# Patient Record
Sex: Female | Born: 1960
Health system: Southern US, Community
[De-identification: ages and names within clinical notes are randomized; demographics above are authoritative.]

## PROBLEM LIST (undated history)

## (undated) DIAGNOSIS — Z9851 Tubal ligation status: Secondary | ICD-10-CM

## (undated) DIAGNOSIS — I10 Essential (primary) hypertension: Secondary | ICD-10-CM

## (undated) DIAGNOSIS — E079 Disorder of thyroid, unspecified: Secondary | ICD-10-CM

## (undated) HISTORY — PX: TUBAL LIGATION: SHX77

## (undated) HISTORY — DX: Essential (primary) hypertension: I10

## (undated) HISTORY — DX: Tubal ligation status: Z98.51

## (undated) HISTORY — DX: Disorder of thyroid, unspecified: E07.9

---

## 2011-10-13 ENCOUNTER — Encounter: Payer: Self-pay | Admitting: Family Medicine

## 2011-10-13 ENCOUNTER — Ambulatory Visit (INDEPENDENT_AMBULATORY_CARE_PROVIDER_SITE_OTHER): Payer: Self-pay | Admitting: Family Medicine

## 2011-10-13 VITALS — BP 142/100 | HR 64 | Temp 98.6°F | Ht 60.6 in | Wt 178.0 lb

## 2011-10-13 DIAGNOSIS — Z124 Encounter for screening for malignant neoplasm of cervix: Secondary | ICD-10-CM | POA: Insufficient documentation

## 2011-10-13 DIAGNOSIS — Z9851 Tubal ligation status: Secondary | ICD-10-CM

## 2011-10-13 DIAGNOSIS — Z1211 Encounter for screening for malignant neoplasm of colon: Secondary | ICD-10-CM | POA: Insufficient documentation

## 2011-10-13 DIAGNOSIS — I10 Essential (primary) hypertension: Secondary | ICD-10-CM

## 2011-10-13 HISTORY — DX: Essential (primary) hypertension: I10

## 2011-10-13 HISTORY — DX: Tubal ligation status: Z98.51

## 2011-10-13 NOTE — Progress Notes (Signed)
Interpreter Wyvonnia Dusky at [facility] for Dr Rivka Safer

## 2011-10-13 NOTE — Progress Notes (Signed)
  Subjective:    Patient ID: Caroline Oneal, female    DOB: [birth year], 51 y.o.   MRN: [id number]  HPI 1. Meet New Doctor/Need for Colonoscopy/Need for Pap Smear/ Need for mammogram Patient was interviewed by me for the first time. She was found to be HTN today. BP Readings from Last 3 Encounters:  10/13/11 142/100  Normally the patient is not HTN. She was nervous today. She is waiting for her [facility] card. She has no other past medical history. She is obese. Body mass index is 34.08 kg/(m^2). Her goals are to lose weight.   Tobacco use: Patient is a non smoker.  Review of Systems Pertinent items are noted in HPI.     Objective:   Physical Exam Filed Vitals:   10/13/11 0946  BP: 142/100  Pulse: 64  Temp: 98.6 F (37 C)  TempSrc: Oral  Height: 5' 0.6" (1.539 m)  Weight: 178 lb (80.74 kg)  Gen: Hispanic female - communicated with translator. Nad. Pleasant. Obese.  Lungs:  Normal respiratory effort, chest expands symmetrically. Lungs are clear to auscultation, no crackles or wheezes. Heart - Regular rate and rhythm.  No murmurs, gallops or rubs.    Extremities:   Non-tender, No cyanosis, edema, or deformity noted.    Assessment & Plan:

## 2011-10-13 NOTE — Assessment & Plan Note (Signed)
Patient needs PAP done. Last was 6 years ago and was normal. She will reschedule an appointment for this.

## 2011-10-13 NOTE — Assessment & Plan Note (Signed)
Patient needs a colonoscopy. She has no insurance.  I will put a referral in for a colonoscopy when she returns in two weeks. No bleeding/weight loss/stool change/constipation/fam his. Of colon ca.

## 2011-10-13 NOTE — Assessment & Plan Note (Addendum)
HTN today, normally not she says.  Will recheck in 2 weeks.

## 2011-10-13 NOTE — Patient Instructions (Signed)
It was great to see you today!  Schedule an appointment to see me in 2 weeks.   We will do a PAP next visit and check your blood pressure.

## 2011-10-27 ENCOUNTER — Ambulatory Visit (INDEPENDENT_AMBULATORY_CARE_PROVIDER_SITE_OTHER): Payer: Self-pay | Admitting: Family Medicine

## 2011-10-27 ENCOUNTER — Encounter: Payer: Self-pay | Admitting: Family Medicine

## 2011-10-27 VITALS — BP 169/118 | HR 66 | Temp 98.9°F | Wt 178.3 lb

## 2011-10-27 DIAGNOSIS — I10 Essential (primary) hypertension: Secondary | ICD-10-CM

## 2011-10-27 MED ORDER — HYDROCHLOROTHIAZIDE 25 MG PO TABS: 25.0000 mg | ORAL_TABLET | Freq: Every day | ORAL | Status: DC

## 2011-10-27 NOTE — Assessment & Plan Note (Signed)
BP Readings from Last 3 Encounters:  10/27/11 169/118  10/13/11 142/100  Still high today. Will start HCTZ 25 mg daily. F/u in two weeks.

## 2011-10-27 NOTE — Patient Instructions (Addendum)
It was great to see you today!  Schedule an appointment to see me in 2 weeks.  Schedule an appointment for your complete physical in one week.   Meds ordered this encounter  Medications  . hydrochlorothiazide (HYDRODIURIL) 25 MG tablet    Sig: Take 1 tablet (25 mg total) by mouth daily.    Dispense:  30 tablet    Refill:  6

## 2011-10-27 NOTE — Progress Notes (Signed)
  Subjective:   Patient ID: Caroline Oneal, female DOB: 01-09-1961 51 y.o. MRN: [id number] Carried out with spanish phone line HPI:  1. HTN BP Readings from Last 3 Encounters:  10/27/11 169/118  10/13/11 142/100   Onset: has been chronic  Time period of: year(s).  Course of her symptoms over time is constant. Aggravating: unknown Alleviating: none Associated sx/sn: headache and blurred vision.  Not on any meds. Has a high salt diet.   Tobacco use: Patient is a non smoker.   Review of Systems: Pertinent items are noted in HPI.  Labs Reviewed: none    Objective:   Filed Vitals:   10/27/11 1540  BP: 169/118  Pulse: 66  Temp: 98.9 F (37.2 C)  TempSrc: Oral  Weight: 178 lb 4.8 oz (80.876 kg)   Physical Exam: General: hispanic female, nad Lungs:  Normal respiratory effort, chest expands symmetrically. Lungs are clear to auscultation, no crackles or wheezes. Heart - Regular rate and rhythm.  No murmurs, gallops or rubs.    Extremities:   Non-tender, No cyanosis, edema, or deformity noted. Fundi: normal vessels  Assessment & Plan:

## 2011-11-07 ENCOUNTER — Encounter: Payer: Self-pay | Admitting: Family Medicine

## 2011-11-07 ENCOUNTER — Ambulatory Visit (INDEPENDENT_AMBULATORY_CARE_PROVIDER_SITE_OTHER): Payer: Self-pay | Admitting: Family Medicine

## 2011-11-07 VITALS — BP 138/72 | HR 74 | Temp 98.6°F | Ht 66.0 in | Wt 177.0 lb

## 2011-11-07 DIAGNOSIS — I10 Essential (primary) hypertension: Secondary | ICD-10-CM

## 2011-11-07 NOTE — Progress Notes (Signed)
  Subjective:   Patient ID: Caroline Oneal, female DOB: [birth year] 51 y.o. MRN: [id number] HPI:  1. HTN - well controlled Complaint with medications and diet. No headache, or visual changes, or chest pain/sob. BP Readings from Last 3 Encounters:  11/07/11 138/72  10/27/11 169/118  10/13/11 142/100    History  Substance Use Topics  . Smoking status: Never Smoker   . Smokeless tobacco: Not on file  . Alcohol Use: No   Review of Systems: Pertinent items are noted in HPI.  Labs Reviewed: none    Objective:   Filed Vitals:   11/07/11 1433  BP: 138/72  Pulse: 74  Temp: 98.6 F (37 C)  TempSrc: Oral  Height: 5\' 6"  (1.676 m)  Weight: 177 lb (80.287 kg)   Physical Exam: General: hispanic feamle, NAD.  With translator Lungs:  Normal respiratory effort, chest expands symmetrically. Lungs are clear to auscultation, no crackles or wheezes. Heart - Regular rate and rhythm.  No murmurs, gallops or rubs.    Abdomen: soft and non-tender without masses, organomegaly or hernias noted.  No guarding or rebound Extremities:   Non-tender, No cyanosis, edema, or deformity noted. Assessment & Plan:

## 2011-11-07 NOTE — Assessment & Plan Note (Addendum)
BP Readings from Last 3 Encounters:  11/07/11 138/72  10/27/11 169/118  10/13/11 142/100  well controlled now with HCTZ Advised to continue current meds, and low salt diet <1500 mg daily

## 2011-11-07 NOTE — Patient Instructions (Signed)
F/u for pap with female provider.  Your HTN is well controlled now.  Continue current medications.

## 2011-11-14 ENCOUNTER — Encounter: Payer: Self-pay | Admitting: Family Medicine

## 2011-12-04 ENCOUNTER — Encounter: Payer: Self-pay | Admitting: Family Medicine

## 2011-12-04 ENCOUNTER — Ambulatory Visit (INDEPENDENT_AMBULATORY_CARE_PROVIDER_SITE_OTHER): Payer: Self-pay | Admitting: Family Medicine

## 2011-12-04 VITALS — BP 132/68 | HR 84 | Temp 99.5°F | Wt 177.0 lb

## 2011-12-04 DIAGNOSIS — D509 Iron deficiency anemia, unspecified: Secondary | ICD-10-CM

## 2011-12-04 DIAGNOSIS — I10 Essential (primary) hypertension: Secondary | ICD-10-CM

## 2011-12-04 MED ORDER — HYDROCHLOROTHIAZIDE 25 MG PO TABS: 25.0000 mg | ORAL_TABLET | Freq: Every day | ORAL | Status: DC

## 2011-12-04 NOTE — Assessment & Plan Note (Signed)
BP Readings from Last 3 Encounters:  12/04/11 132/68  11/07/11 138/72  10/27/11 169/118  blood pressure well controlled now with HCTZ.

## 2011-12-04 NOTE — Patient Instructions (Signed)
It was great to see you today!  Meds ordered this encounter  Medications  . hydrochlorothiazide (HYDRODIURIL) 25 MG tablet    Sig: Take 1 tablet (25 mg total) by mouth daily.    Dispense:  30 tablet    Refill:  6

## 2011-12-04 NOTE — Progress Notes (Signed)
  Subjective:   Patient ID: Caroline Oneal, female DOB: September 05, 1960 51 y.o. MRN: [id number] HPI:  1. HTN - well controlled Complaint with medications and diet. No headache, or visual changes, or chest pain/sob. BP Readings from Last 3 Encounters:  12/04/11 132/68  11/07/11 138/72  10/27/11 169/118    History  Substance Use Topics  . Smoking status: Never Smoker   . Smokeless tobacco: Not on file  . Alcohol Use: No   Review of Systems: Pertinent items are noted in HPI.  Labs Reviewed: none    Objective:   Filed Vitals:   12/04/11 1646  BP: 132/68  Pulse: 84  Temp: 99.5 F (37.5 C)  TempSrc: Oral  Weight: 177 lb (80.287 kg)   Physical Exam: General: hispanic feamle, NAD.  With translator Lungs:  Normal respiratory effort, chest expands symmetrically. Lungs are clear to auscultation, no crackles or wheezes. Heart - Regular rate and rhythm.  No murmurs, gallops or rubs.    Abdomen: soft and non-tender without masses, organomegaly or hernias noted.  No guarding or rebound Extremities:   Non-tender, No cyanosis, edema, or deformity noted. Assessment & Plan:

## 2011-12-05 LAB — CBC
MCH: 30.5 pg (ref 26.0–34.0)
MCHC: 33.8 g/dL (ref 30.0–36.0)
MCV: 90.3 fL (ref 78.0–100.0)
Platelets: 311 10*3/uL (ref 150–400)
RDW: 14.9 % (ref 11.5–15.5)

## 2011-12-12 ENCOUNTER — Ambulatory Visit (INDEPENDENT_AMBULATORY_CARE_PROVIDER_SITE_OTHER): Payer: Self-pay | Admitting: Family Medicine

## 2011-12-12 ENCOUNTER — Other Ambulatory Visit (HOSPITAL_COMMUNITY)
Admission: RE | Admit: 2011-12-12 | Discharge: 2011-12-12 | Disposition: A | Discharge status: Home / Self Care | Payer: Self-pay | Source: Ambulatory Visit | Attending: Family Medicine | Admitting: Family Medicine

## 2011-12-12 ENCOUNTER — Encounter: Payer: Self-pay | Admitting: Family Medicine

## 2011-12-12 VITALS — BP 131/91 | HR 83 | Temp 98.0°F | Ht 66.0 in | Wt 171.7 lb

## 2011-12-12 DIAGNOSIS — Z01419 Encounter for gynecological examination (general) (routine) without abnormal findings: Secondary | ICD-10-CM

## 2011-12-12 DIAGNOSIS — I10 Essential (primary) hypertension: Secondary | ICD-10-CM

## 2011-12-12 DIAGNOSIS — K5900 Constipation, unspecified: Secondary | ICD-10-CM | POA: Insufficient documentation

## 2011-12-12 DIAGNOSIS — Z23 Encounter for immunization: Secondary | ICD-10-CM

## 2011-12-12 DIAGNOSIS — Z124 Encounter for screening for malignant neoplasm of cervix: Secondary | ICD-10-CM

## 2011-12-12 DIAGNOSIS — Z1239 Encounter for other screening for malignant neoplasm of breast: Secondary | ICD-10-CM

## 2011-12-12 MED ORDER — DOCUSATE SODIUM 100 MG PO CAPS: 100.0000 mg | ORAL_CAPSULE | Freq: Every day | ORAL | Status: AC

## 2011-12-12 NOTE — Progress Notes (Signed)
Interpreter Wyvonnia Dusky for Female physical

## 2011-12-12 NOTE — Patient Instructions (Addendum)
Colace every day as needed for constipation  Will schedule mammogram  Eat high fiber diet - whole wheat, vegetables, can use supplement like metamucil  Return for fasting bloodwork (only water for 8 hours)- make appointment

## 2011-12-12 NOTE — Progress Notes (Signed)
  Subjective:    Patient ID: Caroline Oneal, female    DOB: [birth year], 51 y.o.   MRN: [id number]  HPI  Annual Gynecological Exam  G4L4 Wt Readings from Last 3 Encounters:  12/12/11 171 lb 11.2 oz (77.883 kg)  12/04/11 177 lb (80.287 kg)  11/07/11 177 lb (80.287 kg)   Last period:  10 years ago Regular periods: yes Heavy bleeding: no  Sexually active: yes Birth control or hormonal therapy: Hx of STD: Patient desires STD screening Dyspareunia: No Hot flashes: No Vaginal discharge: no Dysuria:No  Last mammogram: has never had one Breast mass or concerns: No Last Pap: 15 years ago, was normal History of abnormal pap: No   FH of breast, uterine, ovarian, colon cancer: No  Constipation:  Noted a painless bump near rectum.  Was found to be small external hemorrhoid.  Patient reports hard stools  overweight:  Walks an hour every day.  At a fast pace.  Has been feeling well.    Review of Systemsaee HPI     Objective:   Physical Exam GEN: Alert & Oriented, No acute distress Neck: supple, no thyromegaly. CV:  Regular Rate & Rhythm, no murmur Respiratory:  Normal work of breathing, CTAB Abd:  + BS, soft, no tenderness to palpation Ext: no pre-tibial edema .Pelvic Exam:        External: normal female genitalia without lesions or masses        Vagina: normal without lesions or masses        Cervix: normal without lesions or masses        Adnexa: normal bimanual exam without masses or fullness        Uterus: normal by palpation        Pap smear: performed Rectum:  small non acute thrombosed external hemorrhoid       Assessment & Plan:  Will order fasting labwork given fh of DM, overweight, hypertension

## 2011-12-12 NOTE — Assessment & Plan Note (Addendum)
Colace, discussed high fiber diet and fiber supplements.  Will check TSH.  PCP is in process of referring for colorectal screening.

## 2011-12-13 NOTE — Addendum Note (Signed)
Addended by: Edd Arbour on: 12/13/2011 08:44 AM   Modules accepted: Orders

## 2011-12-14 ENCOUNTER — Encounter: Payer: Self-pay | Admitting: Family Medicine

## 2012-01-08 ENCOUNTER — Telehealth: Payer: Self-pay | Admitting: *Deleted

## 2012-01-08 NOTE — Telephone Encounter (Signed)
Patient's daughter called due to patient's "teeth hurting."  Wants referral to dentist.  Informed will need office visit here to be evaluated for dental referral.  Appt scheduled for tomorrow at 4:15pm with Dr. Cristal Ford.  Gaylene Brooks, RN

## 2012-01-09 ENCOUNTER — Ambulatory Visit (INDEPENDENT_AMBULATORY_CARE_PROVIDER_SITE_OTHER): Payer: Self-pay | Admitting: Family Medicine

## 2012-01-09 ENCOUNTER — Encounter: Payer: Self-pay | Admitting: Family Medicine

## 2012-01-09 VITALS — BP 149/86 | HR 63 | Temp 99.0°F | Ht 60.75 in | Wt 177.4 lb

## 2012-01-09 DIAGNOSIS — K0530 Chronic periodontitis, unspecified: Secondary | ICD-10-CM | POA: Insufficient documentation

## 2012-01-09 MED ORDER — PENICILLIN V POTASSIUM 250 MG PO TABS: 250.0000 mg | ORAL_TABLET | Freq: Three times a day (TID) | ORAL | Status: AC

## 2012-01-09 MED ORDER — HYDROCODONE-ACETAMINOPHEN 5-500 MG PO TABS: 1.0000 | ORAL_TABLET | Freq: Three times a day (TID) | ORAL | Status: AC | PRN

## 2012-01-09 NOTE — Progress Notes (Signed)
  Subjective:    Patient ID: Caroline Oneal, female    DOB: [birth year], 51 y.o.   MRN: [id number]  HPI  1. Dental pain. Pain in left maxillary area, upper molar and the right lower molar for the past few days. Soreness in gums. Radiates up to eye region. Mild swelling in sinus. Had two wisdom teeth extracted from upper teeth about 4 months ago. Denies fever, pus, redness, syncope, nausea, diffculty breathing or swallowing.  Review of Systems See HPI otherwise negative.  reports that she has never smoked. She does not have any smokeless tobacco history on file.     Objective:   Physical Exam  Vitals reviewed. Constitutional: She is oriented to person, place, and time. She appears well-developed and well-nourished. No distress.  HENT:  Head: Normocephalic and atraumatic.  Right Ear: External ear normal.  Left Ear: External ear normal.  Nose: Nose normal.  Mouth/Throat: Oropharynx is clear and moist. No oropharyngeal exudate.       Left maxillary TTP. Left upper maxillary molar seems loose, very faint erythema surrounding gingiva. No abscesses seen.  Left cervical LAD mild. Neck supple.  Eyes: EOM are normal. Pupils are equal, round, and reactive to light.  Neck: Normal range of motion. Neck supple.  Cardiovascular: Normal rate, regular rhythm and normal heart sounds.   No murmur heard. Pulmonary/Chest: Effort normal.  Musculoskeletal: She exhibits no edema and no tenderness.  Neurological: She is alert and oriented to person, place, and time. No cranial nerve deficit.       Speech normal. No CN deficits.  Skin: No rash noted.  Psychiatric: She has a normal mood and affect.          Assessment & Plan:

## 2012-01-09 NOTE — Patient Instructions (Addendum)
You may have infection in your teeth. Start taking penicillin three times daily. You may use pain medication if necessary. Will make new dental referral. If you have fevers, more swelling, trouble swallowing go to the emergency room.  Abceso Dental (Dental Abscess) Usted tiene un absceso en un diente. Este problema aparece cuando un diente se infecta. Los antibiticos y los analgsicos podrn Automotive engineer, Biomedical engineer las infecciones dentales requieren la atencin de un dentista. Enjuguese la zona infectada con agua con sal (una pizca en 250 ml [8 oz] de agua tibia). No aplique calor en la zona externa del rostro Consulte a su dentista o al cirujano lo antes posible. SOLICITE ATENCIN MDICA DE INMEDIATO SI PRESENTA:  Dolor intenso que [place] y no [place] con medicamentos   Usted o su nio tienen una temperatura oral de ms de 102 F (38.9 C) y no puede controlarla con medicamentos.   Su beb tiene ms de 3 meses y su temperatura rectal es de 102 F (38.9 C) o ms.   Su beb tiene 3 meses o menos y su temperatura rectal es de 100.4 F (38 C) o ms.   Le siente escalofros, dolor de cabeza intenso, dificultad para respirar o tragar.   Observa hinchazn en el cuello o alrededor del ojo.  Document Released: 06/12/2005 Document Revised: 06/01/2011 [facility] Patient Information 2012 [place], [place].

## 2012-01-09 NOTE — Assessment & Plan Note (Signed)
Worsening pain in molar. Had adjacent extraction 4 months ago. No fever or systemic signs. Start penicillin to prevent spread of infection, vicodin and motrin prn for pain. Dental referral-advised by Rudell Cobb that new referral is necessary for evaluation. Advised to f/u in ED for worsening symptoms, fever, emesis, difficulty swallowing.

## 2012-10-04 ENCOUNTER — Ambulatory Visit: Payer: Self-pay | Admitting: Family Medicine

## 2012-10-10 ENCOUNTER — Encounter: Payer: Self-pay | Admitting: Family Medicine

## 2012-10-10 ENCOUNTER — Ambulatory Visit (INDEPENDENT_AMBULATORY_CARE_PROVIDER_SITE_OTHER): Payer: No Typology Code available for payment source | Admitting: Family Medicine

## 2012-10-10 ENCOUNTER — Ambulatory Visit (HOSPITAL_COMMUNITY)
Admission: RE | Admit: 2012-10-10 | Discharge: 2012-10-10 | Disposition: A | Discharge status: Home / Self Care | Payer: No Typology Code available for payment source | Source: Ambulatory Visit | Attending: Family Medicine | Admitting: Family Medicine

## 2012-10-10 VITALS — BP 149/81 | HR 72 | Ht 60.5 in | Wt 160.0 lb

## 2012-10-10 DIAGNOSIS — M25561 Pain in right knee: Secondary | ICD-10-CM

## 2012-10-10 DIAGNOSIS — I10 Essential (primary) hypertension: Secondary | ICD-10-CM

## 2012-10-10 DIAGNOSIS — K5900 Constipation, unspecified: Secondary | ICD-10-CM

## 2012-10-10 DIAGNOSIS — M25569 Pain in unspecified knee: Secondary | ICD-10-CM | POA: Insufficient documentation

## 2012-10-10 LAB — CBC
HCT: 40.7 % (ref 36.0–46.0)
MCHC: 33.9 g/dL (ref 30.0–36.0)
Platelets: 365 10*3/uL (ref 150–400)
RDW: 14.3 % (ref 11.5–15.5)
WBC: 8.3 10*3/uL (ref 4.0–10.5)

## 2012-10-10 MED ORDER — HYDROCHLOROTHIAZIDE 25 MG PO TABS: 25.0000 mg | ORAL_TABLET | Freq: Every day | ORAL | Status: DC

## 2012-10-10 NOTE — Assessment & Plan Note (Signed)
Began after a fall 3 months ago. Seems likely for internal derangement possibly meniscal injury. Check plain film to rule out bone fragment or pre-existing osteoarthritis. Recommend OTC analgesics, rest, ice. F/u in 3-4 weeks for further assessment.

## 2012-10-10 NOTE — Patient Instructions (Addendum)
Get your xray done of knee.  Get done at [facility] radiology on first floor.  You can try aleve, ibuprofen, or tylenol (acetaminophen).  Make an appointment in 3-4 weeks for check up. We can talk about xray and blood work then.

## 2012-10-10 NOTE — Assessment & Plan Note (Signed)
Above goal due to noncompliance. med refilled. Will get fasting labs today since she never returned previously.

## 2012-10-10 NOTE — Progress Notes (Signed)
  Subjective:    Patient ID: Caroline Oneal, female    DOB: 05/18/1961, 52 y.o.   MRN: [id number]  Hypertension   Presents with daughter, refuses formal or phone interpretor.   1. Right knee pain. Started 3 months ago after she fell while running down some stairs. Landed directly onto right knee. Some pain initially, but was able to walk. Experiences now some intermittent pain in medial and posterior aspect, mild intermittent swelling also. Feels some popping and unstable at times. Worse with climbing stairs and at night time. Motrin helps some.  Denies fever, chills, recent falls, calf pain, redness, previous hx of knee problems.   2. HTN. Ran out of med 6 months ago. Never returned for fasting labs ordered last visit. Requests refills. No chest pain, edema, rash, nausea, emesis.  Review of Systems See HPI otherwise negative.  reports that she has never smoked. She does not have any smokeless tobacco history on file.     Objective:   Physical Exam  Vitals reviewed. Constitutional: She is oriented to person, place, and time. She appears well-developed and well-nourished. No distress.  HENT:  Head: Normocephalic and atraumatic.  Cardiovascular: Normal rate, regular rhythm and normal heart sounds.   Musculoskeletal: She exhibits tenderness.  Right knee lacks 5-10 degree flexion. Extension is intact bilaterally.  Right knee: Mild-moderate TTP at medial joint line. Trace suprapatellar effusion likely on right. Positive mcmurrays test-pain elicited. Patella has some decreased ROM, grinding is present.   No calf erythema, edema or rash.  2+ DP pulses.   Neurological: She is alert and oriented to person, place, and time.  Skin: She is not diaphoretic.       Assessment & Plan:

## 2012-10-11 LAB — COMPREHENSIVE METABOLIC PANEL
ALT: 13 U/L (ref 0–35)
AST: 15 U/L (ref 0–37)
Alkaline Phosphatase: 91 U/L (ref 39–117)
Calcium: 9.7 mg/dL (ref 8.4–10.5)
Chloride: 107 mEq/L (ref 96–112)
Creat: 0.61 mg/dL (ref 0.50–1.10)

## 2012-10-11 LAB — LIPID PANEL
HDL: 42 mg/dL (ref >39)
LDL Cholesterol: 107 mg/dL — ABNORMAL HIGH (ref 0–99)
Total CHOL/HDL Ratio: 4.4 Ratio
VLDL: 37 mg/dL (ref 0–40)

## 2012-10-21 ENCOUNTER — Telehealth: Payer: Self-pay | Admitting: Family Medicine

## 2012-10-21 NOTE — Telephone Encounter (Signed)
Please inform patient that her labs showed some abnormality in the thyroid, and we should do another lab test to investigate further. She needs a follow up visit with me in next 2-3 weeks.

## 2012-10-22 NOTE — Telephone Encounter (Signed)
I called pt and lvm to call us back,  Pt has a future appt on 05/14@3 :15pm. I will try to call later.  Marines

## 2012-11-06 ENCOUNTER — Ambulatory Visit: Payer: Self-pay | Admitting: Family Medicine

## 2014-03-31 ENCOUNTER — Ambulatory Visit: Payer: Self-pay | Attending: Internal Medicine

## 2014-06-03 ENCOUNTER — Ambulatory Visit: Payer: Self-pay | Attending: Family Medicine | Admitting: Family Medicine

## 2014-06-03 ENCOUNTER — Ambulatory Visit (HOSPITAL_BASED_OUTPATIENT_CLINIC_OR_DEPARTMENT_OTHER): Payer: Self-pay | Admitting: *Deleted

## 2014-06-03 ENCOUNTER — Encounter: Payer: Self-pay | Admitting: Family Medicine

## 2014-06-03 VITALS — BP 180/84 | HR 60 | Temp 98.4°F | Resp 18 | Ht 62.0 in | Wt 165.0 lb

## 2014-06-03 DIAGNOSIS — R7303 Prediabetes: Secondary | ICD-10-CM | POA: Insufficient documentation

## 2014-06-03 DIAGNOSIS — I10 Essential (primary) hypertension: Secondary | ICD-10-CM | POA: Insufficient documentation

## 2014-06-03 DIAGNOSIS — Z23 Encounter for immunization: Secondary | ICD-10-CM

## 2014-06-03 DIAGNOSIS — E669 Obesity, unspecified: Secondary | ICD-10-CM

## 2014-06-03 DIAGNOSIS — R7309 Other abnormal glucose: Secondary | ICD-10-CM

## 2014-06-03 DIAGNOSIS — R7989 Other specified abnormal findings of blood chemistry: Secondary | ICD-10-CM

## 2014-06-03 DIAGNOSIS — R946 Abnormal results of thyroid function studies: Secondary | ICD-10-CM

## 2014-06-03 DIAGNOSIS — E039 Hypothyroidism, unspecified: Secondary | ICD-10-CM

## 2014-06-03 LAB — CBC WITH DIFFERENTIAL/PLATELET
BASOS ABS: 0.1 10*3/uL (ref 0.0–0.1)
BASOS PCT: 1 % (ref 0–1)
Eosinophils Absolute: 0.3 10*3/uL (ref 0.0–0.7)
Eosinophils Relative: 5 % (ref 0–5)
HCT: 39.8 % (ref 36.0–46.0)
Hemoglobin: 13.5 g/dL (ref 12.0–15.0)
LYMPHS PCT: 35 % (ref 12–46)
Lymphs Abs: 2.3 10*3/uL (ref 0.7–4.0)
MCH: 30.1 pg (ref 26.0–34.0)
MCHC: 33.9 g/dL (ref 30.0–36.0)
MCV: 88.8 fL (ref 78.0–100.0)
MONO ABS: 0.5 10*3/uL (ref 0.1–1.0)
MONOS PCT: 7 % (ref 3–12)
MPV: 9.5 fL (ref 9.4–12.4)
NEUTROS ABS: 3.4 10*3/uL (ref 1.7–7.7)
Neutrophils Relative %: 52 % (ref 43–77)
PLATELETS: 325 10*3/uL (ref 150–400)
RBC: 4.48 MIL/uL (ref 3.87–5.11)
RDW: 14.1 % (ref 11.5–15.5)
WBC: 6.6 10*3/uL (ref 4.0–10.5)

## 2014-06-03 LAB — POCT GLYCOSYLATED HEMOGLOBIN (HGB A1C): Hemoglobin A1C: 6.1

## 2014-06-03 MED ORDER — HYDROCHLOROTHIAZIDE 25 MG PO TABS: 25.0000 mg | ORAL_TABLET | Freq: Every day | ORAL | Status: DC

## 2014-06-03 NOTE — Assessment & Plan Note (Signed)
A: prediabetes P: Weight loss Low carb diet Evaluate and treat hypothyroidism

## 2014-06-03 NOTE — Assessment & Plan Note (Addendum)
A: BP elevated. P: Thiazide diuretic at max dose Close f/u in 2 weeks If BP still above goal at f/u add ACE/ARB

## 2014-06-03 NOTE — Progress Notes (Signed)
   Subjective:    Patient ID: Derenda FennelMaria Renteria, female    DOB: [birth year], 53 y.o.   MRN: [id number] CC; chronic HTN, untreated  HPI 1. CHRONIC HYPERTENSION  Disease Monitoring  Blood pressure range: does not check   Chest pain: no   Dyspnea: no   Claudication: no   Medication compliance: no  Medication Side Effects  Lightheadedness: yes, sometimes    Urinary frequency: no   Edema: no   Impotence: no   Preventitive Healthcare:  Exercise: no   Diet Pattern: low carb,   Salt Restriction: yes   2. Elevated TSH: [facility]. Patient did not get message. F/u labs not done. Admits to tingling in hands and weight gain. Denies edema, chest pain, SOB, presyncope/syncope.   Soc Hx: non smoker Med Hx: HTN since 09/2011  Fam Hx: negative for HTN  Review of Systems As per HPI     Objective:   Physical Exam BP 180/84 mmHg  Pulse 60  Temp(Src) 98.4 F (36.9 C) (Oral)  Resp 18  Ht 5\' 2"  (1.575 m)  Wt 165 lb (74.844 kg)  BMI 30.17 kg/m2  SpO2 99%  Wt Readings from Last 3 Encounters:  06/03/14 165 lb (74.844 kg)  10/10/12 160 lb (72.576 kg)  01/09/12 177 lb 6.4 oz (80.468 kg)    BP Readings from Last 3 Encounters:  06/03/14 180/84  10/10/12 149/81  01/09/12 149/86  General appearance: alert, cooperative and no distress, puffiness around eyes and cheeks.  Head: Normocephalic, without obvious abnormality, atraumatic Eyes: conjunctivae/corneas clear. PERRL, EOM's intact. Fundi benign. Neck: no adenopathy, no carotid bruit, no JVD, supple, symmetrical, trachea midline and thyroid not enlarged, symmetric, no tenderness/mass/nodules Lungs: clear to auscultation bilaterally Heart: regular rate and rhythm, S1, S2 normal, no murmur, click, rub or gallop Extremities: extremities normal, atraumatic, no cyanosis or edema       Assessment & Plan:

## 2014-06-03 NOTE — Patient Instructions (Addendum)
Caroline Oneal,  Thank you for coming in today. It was a pleasure meeting you. I look forward to being your primary doctor.  1. High blood start HCTZ 25 mg daily.  Exercise Low salt, low carb, low fat diet. Plenty of vegetables in diet.   2. High TSH concerning for hypothyroidism.  Getting labs. If TSH still high and T3/T4 low will start thyroid replacement hormone.   Caroline HanlyStella will call you with lab results.   F/u in 3 weeks for BP check with nurse F/u in 6 weeks with me.   Dr. Armen Oneal    Hipotiroidismo (Hypothyroidism) La tiroides es una glndula grande ubicada en la parte anterior e inferior del cuello. La glndula tiroides interviene en el control del metabolismo. El metabolismo es el modo en que el organismo utiliza los alimentos. El control del metabolismo se realiza a travs de una hormona denominada tiroxina. Cuando la actividad de esta glndula est por debajo de lo normal (hipotiroidismo) produce muy poca cantidad de hormona. CAUSAS Aqu se incluyen:   Ausencia de tejido tiroideo.  Bocio por dficit de yodo.  Bocio por medicamentos.  Defectos congnitos (desde el nacimiento).  Trastornos de la glndula pituitaria Esto ocasiona la falta de TSH (sigla que significa hormona estimulante de la tiroides) Esta hormona le informa a la tiroides que debe producir ms hormona. SNTOMAS  Letargia (sentir que no se tiene Engineer, drillingenerga)  Intolerancia al fro  Smith Internationalumento de peso (a pesar de una ingesta normal de alimentos)  Piel seca  Cabello seco  Irregularidades menstruales  Enlentecimiento de los procesos de pensamiento La insuficiente cantidad de hormona tiroidea tambin puede ocasionar problemas cardacos. El hipotiroidismo en el recin nacido es el cretinismo en su forma extrema. Es importante que esta forma se trate de modo adecuado e inmediato, ya que puede conducir rpidamente al retardo del desarrollo fsico y mental. DIAGNSTICO Para comprobar la existencia de  hipotiroidismo, Hydrographic surveyorel profesional le solicitar anlisis de sangre y radiografas y estudios con Gravityultrasonido. Muchas veces los signos estn ocultos. Es necesario que el profesional vigile la enfermedad con anlisis de Arcolasangre. Esto se realiza luego de Actorestablecer un diagnstico (determinar cul es el problema). Puede ser necesario que el profesional que lo asiste controle esta enfermedad con anlisis de sangre ya sea antes o despus del diagnstico y Scalp Levelel tratamiento. TRATAMIENTO Los niveles bajos de hormona tiroidea se incrementan con el uso de hormona tiroidea sinttica. Este es un tratamiento seguro y Walnutefectivo. Se dispone de hormona tiroidea sinttica para el tratamiento de este trastorno. Generalmente lleva algunas semanas obtener el efecto total de los medicamentos. Luego de obtener el efecto completo del Fancy Gapmedicamento, habitualmente pasan otras cuatro semanas para que los sntomas empiezan a Geneticist, moleculardesaparecer. El profesional podr comenzar indicndole dosis bajas. Si usted tuvo problemas cardacos, la dosis se aumentar de manera gradual. Podr volver a lo normal sin Marketing executiveentrar en una situacin de emergencia. INSTRUCCIONES PARA EL CUIDADO DOMICILIARIO  Tome los Estée Laudermedicamentos como le ha indicado el profesional que lo asiste. Infrmele al profesional todos los medicamentos que toma o que ha comenzado a Careers advisertomar. El profesional que lo asiste lo ayudar con los esquemas de las dosis.  A medida que obtiene mejora, es necesario aumentar la dosis. Ser necesario Education officer, environmentalrealizar continuos anlisis de Brinkleysangre, segn lo indique el profesional.  Informe acerca de todos los efectos secundarios que sospeche que podran deberse a los medicamentos. SOLICITE ATENCIN MDICA SI: Solicite atencin mdica si observa:  Sudoracin.  Temblores.  Ansiedad.  Rpida prdida de peso.  Intolerancia  al calor.  Cambios emocionales.  Diarrea.  Debilidad. SOLICITE ATENCIN MDICA DE INMEDIATO SI: Presenta dolor en el pecho, una frecuencia  cardaca irregular (palpitaciones) o latidos cardacos rpidos. EST SEGURO QUE:   Comprende las instrucciones para el alta mdica.  Controlar su enfermedad.  Solicitar atencin mdica de inmediato segn las indicaciones. Document Released: 06/12/2005 Document Revised: 09/04/2011 [facility] Patient Information 2015 [place], [place]. This information is not intended to replace advice given to you by your health care provider. Make sure you discuss any questions you have with your health care provider.

## 2014-06-03 NOTE — Assessment & Plan Note (Addendum)
A: suspect hypothyroidism P: TSH Free T3 and T4 Thyroid replacement on monitor response to treatment Lipids   Lab Results  Component Value Date   TSH 14.523* 06/03/2014  low T4 Plan  Weight base synthroid replacement (1/7 mcg/kg/day=127 mcg) = 125 mcg, order sent to pharmacy.

## 2014-06-03 NOTE — Progress Notes (Signed)
Establish Care Hx HTN, not taking medication for more then one year Complaining of dizziness and eye pressure on and off Numbness and pain  on Rt hand  No tobacco  use no ETOH use

## 2014-06-04 LAB — COMPLETE METABOLIC PANEL WITH GFR
ALBUMIN: 4.2 g/dL (ref 3.5–5.2)
ALK PHOS: 79 U/L (ref 39–117)
ALT: 14 U/L (ref 0–35)
AST: 16 U/L (ref 0–37)
BILIRUBIN TOTAL: 0.4 mg/dL (ref 0.2–1.2)
BUN: 16 mg/dL (ref 6–23)
CALCIUM: 9.6 mg/dL (ref 8.4–10.5)
CO2: 27 meq/L (ref 19–32)
Chloride: 109 mEq/L (ref 96–112)
Creat: 0.6 mg/dL (ref 0.50–1.10)
GFR, Est Non African American: >89 mL/min
Glucose, Bld: 99 mg/dL (ref 70–99)
Potassium: 4.5 mEq/L (ref 3.5–5.3)
SODIUM: 141 meq/L (ref 135–145)
TOTAL PROTEIN: 7.4 g/dL (ref 6.0–8.3)

## 2014-06-04 LAB — LIPID PANEL
Cholesterol: 178 mg/dL (ref 0–200)
HDL: 45 mg/dL (ref >39)
LDL CALC: 110 mg/dL — AB (ref 0–99)
TRIGLYCERIDES: 115 mg/dL (ref <150)
Total CHOL/HDL Ratio: 4 Ratio
VLDL: 23 mg/dL (ref 0–40)

## 2014-06-04 LAB — T4, FREE: FREE T4: 0.67 ng/dL — AB (ref 0.80–1.80)

## 2014-06-04 LAB — T3: T3 TOTAL: 104.5 ng/dL (ref 80.0–204.0)

## 2014-06-04 LAB — TSH: TSH: 14.523 u[IU]/mL — AB (ref 0.350–4.500)

## 2014-06-04 MED ORDER — LEVOTHYROXINE SODIUM 125 MCG PO TABS: 125.0000 ug | ORAL_TABLET | Freq: Every day | ORAL | Status: DC

## 2014-06-04 NOTE — Addendum Note (Signed)
Addended by: Dessa PhiFUNCHES, Zeyad Delaguila on: 06/04/2014 02:16 PM   Modules accepted: Orders

## 2014-06-16 ENCOUNTER — Telehealth: Payer: Self-pay | Admitting: *Deleted

## 2014-06-16 NOTE — Telephone Encounter (Signed)
-----   Message from Lora PaulaJosalyn C Funches, MD sent at 06/04/2014  2:12 PM EST ----- Normal CMP Normal CBC with diff Just slight elevated LDL, bad cholesterol, increase vegetables and increase exercise.  TSH elevated with low free T4. Thyroid replacement ordered 125 mcg daily in AM on empty stomach.

## 2014-06-16 NOTE — Telephone Encounter (Signed)
Left voice message to return call 

## 2014-06-24 ENCOUNTER — Ambulatory Visit: Payer: Self-pay | Attending: Family Medicine

## 2014-06-24 NOTE — Progress Notes (Unsigned)
Patient ID: Caroline FennelMaria Oneal, female   DOB: February 26, 1961, 53 y.o.   MRN: [id number] Pt comes in for blood pressure recheck after started on Hydrochlorothiazide  25 mg tab per last visit 06/03/14 Pt states she is compliant with taking medication daily  Denies headache,dizziness or numb/tingling sensation per spanish interpretor BP- 122/77 54 Pt given instructions to continue taking medication and return at next appointment DASH/Hypertension literature given per AVS-Shanaya Schneck,RN

## 2014-06-24 NOTE — Patient Instructions (Signed)
Hipertensin (Hypertension) La hipertensin, conocida comnmente como presin arterial alta, se produce cuando la sangre bombea en las arterias con mucha fuerza. Las arterias son los vasos sanguneos que transportan la sangre desde el corazn hacia todas las partes del cuerpo. Una lectura de la presin arterial consiste en un nmero ms alto sobre un nmero ms bajo, por ejemplo, 110/72. El nmero ms alto (presin sistlica) corresponde a la presin interna de las arterias cuando el corazn [place]. El nmero ms bajo (presin diastlica) corresponde a la presin interna de las arterias cuando el corazn se relaja. En condiciones ideales, la presin arterial debe ser inferior a 120/80. La hipertensin fuerza al corazn a trabajar ms para Herbalist. Las arterias pueden estrecharse o ponerse rgidas. La hipertensin conlleva el riesgo de enfermedad cardaca, ictus y otros problemas.  [place] de riesgo de hipertensin son controlables, pero otros no lo son.  [employer] factores de riesgo que usted no puede Chief Technology Officer, se incluyen:   Manufacturing systems engineer. El riesgo es mayor para las Retail banker.  La edad. Los riesgos aumentan con la edad.  El sexo. Antes de los 45aos, los hombres corren ms [employer]. Despus de los 65aos, las mujeres corren ms [employer]. Entre los factores de riesgo que usted puede Chief Technology Officer, se incluyen:  No hacer la cantidad suficiente de actividad fsica o ejercicio.  Tener sobrepeso.  Consumir mucha grasa, azcar, caloras o sal en la dieta.  Beber alcohol en exceso. SIGNOS Y SNTOMAS Por lo general, la hipertensin no causa signos o sntomas. La hipertensin demasiado alta (crisis hipertensiva) puede causar dolor de cabeza, ansiedad, falta de aire y hemorragia nasal. DIAGNSTICO  Para detectar si usted tiene hipertensin, el mdico le medir la presin arterial mientras est sentado, con el brazo  levantado a la altura del corazn. Debe medirla al [facility] veces en el mismo brazo. Determinadas condiciones pueden causar una diferencia de presin arterial entre el brazo izquierdo y Insurance underwriter. El hecho de tener una sola lectura de la presin arterial ms alta que lo normal no significa que Stage manager. En el caso de tener una lectura de la presin arterial con un valor alto, pdale al mdico que la verifique nuevamente. [place] hipertensin arterial incluye hacer cambios en el estilo de vida y, posiblemente, tomar medicamentos. Un estilo de vida saludable puede ayudar a bajar la presin arterial alta. Quiz deba cambiar algunos hbitos. Los [employer] en el estilo de vida pueden incluir:  Seguir la dieta DASH. Esta dieta tiene un alto contenido de frutas, verduras y Psychologist, prison and probation services. Incluye poca cantidad de sal, carnes rojas y azcares agregados.  Hacer al menos 2horas de actividad fsica enrgica todas las semanas.  Perder peso, si es necesario.  No fumar.  Limitar el consumo de bebidas alcohlicas.  Aprender formas de reducir el estrs. Si los cambios en el estilo de vida no son suficientes para Child psychotherapist la presin arterial, el mdico puede recetarle medicamentos. Quiz necesite tomar ms de uno. Trabaje en conjunto con su mdico para comprender los riesgos y los beneficios. INSTRUCCIONES PARA EL CUIDADO EN EL HOGAR  Haga que le midan de nuevo la presin arterial segn las indicaciones del [place] los medicamentos solamente como se lo haya indicado el mdico. Siga cuidadosamente las indicaciones. Los medicamentos para la presin arterial deben tomarse segn las indicaciones. Los medicamentos pierden eficacia al omitir las dosis. El hecho de omitir  las dosis tambin aumenta el riesgo de otros problemas.  No fume.  Contrlese la presin arterial en su casa segn las indicaciones del mdico. SOLICITE ATENCIN MDICA SI:   Piensa  que tiene una reaccin alrgica a los medicamentos.  Tiene mareos o dolores de cabeza con recurrencia.  Tiene hinchazn en los tobillos.  Tiene problemas de visin. SOLICITE ATENCIN MDICA DE INMEDIATO SI:  Siente un dolor de cabeza intenso o confusin.  Siente debilidad inusual, adormecimiento o que se desmayar.  Siente dolor intenso en el pecho o en el abdomen.  Vomita repetidas veces.  Tiene dificultad para respirar. ASEGRESE DE QUE:   Comprende estas instrucciones.  Controlar su afeccin.  Recibir ayuda de inmediato si no mejora o si empeora. Document Released: 06/12/2005 Document Revised: 10/27/2013 ExitCare Patient Information 2015 ExitCare, LLC. This information is not intended to replace advice given to you by your health care provider. Make sure you discuss any questions you have with your health care provider. Plan de alimentacin DASH (DASH Eating Plan) DASH es la sigla en ingls de "Enfoques Alimentarios para Detener la Hipertensin". El plan de alimentacin DASH ha demostrado bajar la presin arterial elevada (hipertensin). Los beneficios adicionales para la salud pueden incluir la disminucin del riesgo de diabetes mellitus tipo2, enfermedades cardacas e ictus. Este plan tambin puede ayudar a adelgazar. QU DEBO SABER ACERCA DEL PLAN DE ALIMENTACIN DASH? Para el plan de alimentacin DASH, seguir las siguientes pautas generales:  Elija los alimentos con un valor porcentual diario de sodio de menos del 5% (segn figura en la etiqueta del alimento).  Use hierbas o aderezos sin sal, en lugar de sal de mesa o sal marina.  Consulte al mdico o farmacutico antes de usar sustitutos de la sal.  Coma productos con bajo contenido de sodio, cuya etiqueta suele decir "bajo contenido de sodio" o "sin agregado de sal".  Coma alimentos frescos.  Coma ms verduras, frutas y productos lcteos con bajo contenido de grasas.  Elija los cereales integrales. Busque  la palabra "integral" en el primer lugar de la lista de ingredientes.  Elija el pescado y el pollo o el pavo sin piel ms a menudo que las carnes rojas. Limite el consumo de pescado, carne de ave y carne a 6onzas (170g) por da.  Limite el consumo de dulces, postres, azcares y bebidas azucaradas.  Elija las grasas saludables para el corazn.  Limite el consumo de queso a 1onza (28g) por da.  Consuma ms comida casera y menos de restaurante, de buf y comida rpida.  Limite el consumo de alimentos fritos.  Cocine los alimentos utilizando mtodos que no sean la fritura.  Limite las verduras enlatadas. Si las consume, enjuguelas bien para disminuir el sodio.  Cuando coma en un restaurante, pida que preparen su comida con menos sal o, en lo posible, sin nada de sal. QU ALIMENTOS PUEDO COMER? Pida ayuda a un nutricionista para conocer las necesidades calricas individuales. Cereales Pan de salvado o integral. Arroz integral. Pastas de salvado o integrales. Quinua, trigo burgol y cereales integrales. Cereales con bajo contenido de sodio. Tortillas de harina de maz o de salvado. Pan de maz integral. Galletas saladas integrales. Galletas con bajo contenido de sodio. Vegetales Verduras frescas o congeladas (crudas, al vapor, asadas o grilladas). Jugos de tomate y verduras con contenido bajo o reducido de sodio. Pasta y salsa de tomate con contenido bajo o reducido de sodio. Verduras enlatadas con bajo contenido de sodio o reducido de sodio.  Frutas Frutas frescas,   en conserva (en su jugo natural) o frutas congeladas. Carnes y otros productos con protenas Carne de res molida (al 85% o ms [place]omagra), carne de res de animales alimentados con pastos o carne de res sin la grasa. Pollo o pavo sin piel. Carne de pollo o de [place] Forestpavo molida. Cerdo sin la grasa. Todos los pescados y frutos de mar. Huevos. Porotos, guisantes o lentejas secos. Frutos secos y semillas sin sal. Frijoles enlatados sin  sal. Lcteos Productos lcteos con bajo contenido de grasas, como [place] Cityleche descremada o al 1%, quesos reducidos en grasas o al 2%, ricota con bajo contenido de grasas o Leggett & Plattqueso cottage, o yogur natural con bajo contenido de Sun[place] Villagegrasas. Quesos con contenido bajo o reducido de sodio. Grasas y Writeraceites Margarinas en barra que no contengan grasas trans. [place] y alios para ensaladas livianos o reducidos en grasas (reducidos en sodio). [place]. Aceites de crtamo, oliva o canola. [place] natural de man o almendra. Otros Palomitas de maz y pretzels sin sal. Los artculos mencionados arriba pueden no ser Raytheonuna lista completa de las bebidas o los alimentos recomendados. Comunquese con el nutricionista para conocer ms opciones. QU ALIMENTOS NO SE RECOMIENDAN? Cereales Pan blanco. Pastas blancas. Arroz blanco. Pan de maz refinado. Bagels y croissants. Galletas saladas que contengan grasas trans. Vegetales Vegetales con crema o fritos. Verduras en salsa de [place] Cityqueso. Verduras enlatadas comunes. Pasta y salsa de tomate en lata comunes. Jugos comunes de tomate y de verduras. Nils PyleFrutas Frutas secas. Fruta enlatada en almbar liviano o espeso. Jugo de frutas. Carnes y otros productos con protenas Cortes de carne con Holiday representativegrasa. Costillas, alas de pollo, tocineta, salchicha, mortadela, salame, chinchulines, tocino, perros calientes, salchichas alemanas y embutidos envasados. Frutos secos y semillas con sal. Frijoles con sal en lata. Lcteos Leche entera o al 2%, crema, mezcla de [place]leche y crema, y queso crema. Yogur entero o endulzado. Quesos o queso azul con alto contenido de Neurosurgeongrasas. [place]remas no lcteas y coberturas batidas. Quesos procesados, quesos para untar o cuajadas. Condimentos Sal de cebolla y ajo, sal condimentada, sal de mesa y sal marina. Salsas en lata y envasadas. Salsa [place]. Salsa trtara. Salsa barbacoa. Salsa teriyaki. Salsa de soja, incluso la que tiene contenido reducido de [place]acsodio. Salsa de  carne. Salsa de pescado. Salsa de [place]ostras. Salsa rosada. Rbano picante. Ketchup y mostaza. Saborizantes y tiernizantes para carne. Caldo en cubitos. Salsa picante. Salsa tabasco. Adobos. Aderezos para tacos. Salsas. Grasas y [address] Mainaceites [place], Indiamargarina en barra, [place] de [place]inncerdo, Protectiongrasa, Singaporemantequilla clarificada y Steffanie Rainwatergrasa de tocino. Aceites de coco, de palmiste o de palma. Aderezos comunes para ensalada. Otros Pickles y Mechanicsburgaceitunas. Palomitas de maz y pretzels con sal. Los artculos mencionados arriba pueden no ser Raytheonuna lista completa de las bebidas y los alimentos que se Theatre stage managerdeben evitar. Comunquese con el nutricionista para obtener ms informacin. DNDE Raelyn MoraPUEDO ENCONTRAR MS INFORMACIN? Instituto Nacional del [place] Heightsorazn, del Pulmn y de la Sangre (National Heart, Lung, and Blood Institute): CablePromo.itwww.nhlbi.nih.gov/health/health-topics/topics/dash/ Document Released: 06/01/2011 Document Revised: 10/27/2013 [facility]Exit[facility] Patient Information 2015 [place]vueExitCare, MarylandLLC. This information is not intended to replace advice given to you by your health care provider. Make sure you discuss any questions you have with your health care provider.

## 2014-07-22 ENCOUNTER — Ambulatory Visit: Payer: Self-pay | Attending: Family Medicine | Admitting: Family Medicine

## 2014-07-22 ENCOUNTER — Encounter: Payer: Self-pay | Admitting: Family Medicine

## 2014-07-22 VITALS — BP 153/85 | HR 62 | Temp 98.3°F | Ht 61.0 in | Wt 159.6 lb

## 2014-07-22 DIAGNOSIS — R200 Anesthesia of skin: Secondary | ICD-10-CM | POA: Insufficient documentation

## 2014-07-22 DIAGNOSIS — R202 Paresthesia of skin: Secondary | ICD-10-CM

## 2014-07-22 DIAGNOSIS — E039 Hypothyroidism, unspecified: Secondary | ICD-10-CM | POA: Insufficient documentation

## 2014-07-22 DIAGNOSIS — I10 Essential (primary) hypertension: Secondary | ICD-10-CM | POA: Insufficient documentation

## 2014-07-22 DIAGNOSIS — M79601 Pain in right arm: Secondary | ICD-10-CM | POA: Insufficient documentation

## 2014-07-22 LAB — VITAMIN B12: Vitamin B-12: 385 pg/mL (ref 211–911)

## 2014-07-22 NOTE — Progress Notes (Signed)
   Subjective:    Patient ID: Caroline Oneal, female    DOB: 1960/11/20, 54 y.o.   MRN: [id number] CC: HTN f/u,   HPI 54 yo F with HTN and hypothyroidism:  1. HTN: compliant with HCTZ. Denies HA, CP, SOB, edema.   2. Hypothyroidism: has not started synthroid. Was not aware that she was suppose to. Denies edema. Admits to numbness and some pain in R arm intermittent.  3. R arm pain: intermittent pain x one month. Mostly at night. Pain with numbness and heaviness. No neck pain, SOB, CP. Patient is R handed w/o weakness. Patient does lay on R side when sleeping. No pain, tingling or numbness in any other extremity.   Soc Hx: non smoker  Review of Systems As per HPI     Objective:   Physical Exam BP 153/85 mmHg  Pulse 62  Temp(Src) 98.3 F (36.8 C) (Oral)  Ht 5\' 1"  (1.549 m)  Wt 159 lb 9.6 oz (72.394 kg)  BMI 30.17 kg/m2  SpO2 99% General appearance: alert, cooperative and no distress Neck: no adenopathy, no carotid bruit, no JVD, supple, symmetrical, trachea midline and thyroid not enlarged, symmetric, no tenderness/mass/nodules, negative Spurling  Lungs: clear to auscultation bilaterally Heart: regular rate and rhythm, S1, S2 normal, no murmur, click, rub or gallop Extremities: extremities normal, atraumatic, no cyanosis or edema       Assessment & Plan:

## 2014-07-22 NOTE — Assessment & Plan Note (Signed)
2. Hypothyroidism: restart synthroid 125 mcg in the mornings 30 minutes before breakfast.

## 2014-07-22 NOTE — Patient Instructions (Addendum)
[name],  Thank you for coming in today.    1. HTN:  Goal is < 140/90 Continue HCTZ 25 mg daily  BP Readings from Last 3 Encounters:  07/22/14 150/85  06/24/14 122/77  06/03/14 180/84    2. Hypothyroidism: restart synthroid 125 mcg in the mornings 30 minutes before breakfast.   3. R arm numbness that comes and goes and night.  Checking vitamin B12 level.  Lay in a neutral position, on back with arms at side.   F/u in 2 weeks with nurse for BP check.  F/u with me in 3 months for HTN and hypothyroidism   Dr. Armen Pickup   Plan de alimentacin DASH (DASH Eating Plan) DASH es la sigla en ingls de "Enfoques Alimentarios para Detener la Hipertensin". El plan de alimentacin DASH ha demostrado bajar la presin arterial elevada (hipertensin). Los beneficios adicionales para la salud pueden incluir la disminucin del riesgo de diabetes mellitus tipo2, enfermedades cardacas e ictus. Este plan tambin puede ayudar a Geophysical data processor. QU DEBO SABER ACERCA DEL PLAN DE ALIMENTACIN DASH? Para el plan de alimentacin DASH, seguir las siguientes pautas generales:  Elija los alimentos con un valor porcentual diario de sodio de menos del 5% (segn figura en la etiqueta del alimento).  Use hierbas o aderezos sin sal, en lugar de sal de mesa o sal marina.  Consulte al mdico o farmacutico antes de usar sustitutos de la sal.  Coma productos con bajo contenido de sodio, cuya etiqueta suele decir "bajo contenido de sodio" o "sin agregado de sal".  Coma alimentos frescos.  Coma ms verduras, frutas y productos lcteos con bajo contenido de [place].  Elija los cereales integrales. Busque la palabra "integral" en Estate agent de la lista de ingredientes.  Elija el pescado y el pollo o el pavo sin piel ms a menudo que las carnes rojas. Limite el consumo de pescado, carne de ave y carne a 6onzas (170g) por Futures trader.  Limite el consumo de dulces, postres, azcares y bebidas  azucaradas.  Elija las grasas saludables para el corazn.  Limite el consumo de queso a 1onza (28g) por Futures trader.  Consuma ms comida casera y menos de restaurante, de buf y comida rpida.  Limite el consumo de alimentos fritos.  Cocine los alimentos utilizando mtodos que no sean la fritura.  Limite las verduras enlatadas. Si las consume, enjuguelas bien para disminuir el sodio.  Cuando coma en un restaurante, pida que preparen su comida con menos sal o, en lo posible, sin nada de sal. QU ALIMENTOS PUEDO COMER? Pida ayuda a un nutricionista para conocer las necesidades calricas individuales. Cereales Pan de salvado o integral. Arroz integral. Pastas de salvado o integrales. Quinua, trigo burgol y cereales integrales. Cereales con bajo contenido de sodio. Tortillas de harina de maz o de salvado. Pan de maz integral. Galletas saladas integrales. Galletas con bajo contenido de [place]. Vegetales Verduras frescas o congeladas (crudas, al vapor, asadas o grilladas). Jugos de tomate y verduras con contenido bajo o reducido de sodio. Pasta y salsa de tomate con contenido bajo o reducido de sodio. Verduras enlatadas con bajo contenido de sodio o reducido de sodio.  Nils Pyle Nils Pyle frescas, en conserva (en su jugo natural) o frutas congeladas. Carnes y otros productos con protenas Carne de res molida (al 85% o ms [place]), carne de res de animales alimentados con pastos o carne de res sin la grasa. Pollo o pavo sin piel. Carne de pollo o de [place]. Cerdo sin la grasa. [employer]  los pescados y frutos de mar. Huevos. Porotos, guisantes o lentejas secos. Frutos secos y semillas sin sal. Frijoles enlatados sin sal. Lcteos Productos lcteos con bajo contenido de grasas, como [place]leche descremada o al 1%, quesos reducidos en grasas o al 2%, ricota con bajo contenido de grasas o [employer]o cottage, o yogur natural con bajo contenido de [place]grasas. Quesos con contenido bajo o reducido de sodio. Grasas y  Writeraceites Margarinas en barra que no contengan grasas trans. [place]ones[place] y alios para ensaladas livianos o reducidos en grasas (reducidos en sodio). [place]. Aceites de crtamo, oliva o canola. Mantequilla natural de man o almendra. Otros Palomitas de maz y pretzels sin sal. Los artculos mencionados arriba pueden no ser Raytheonuna lista completa de las bebidas o los alimentos recomendados. Comunquese con el nutricionista para conocer ms opciones. QU ALIMENTOS NO SE RECOMIENDAN? Cereales Pan blanco. Pastas blancas. Arroz blanco. Pan de maz refinado. Bagels y croissants. Galletas saladas que contengan grasas trans. Vegetales Vegetales con crema o fritos. Verduras en salsa de [place]queso. Verduras enlatadas comunes. Pasta y salsa de tomate en lata comunes. Jugos comunes de tomate y de verduras. Nils PyleFrutas Frutas secas. Fruta enlatada en almbar liviano o espeso. Jugo de frutas. Carnes y otros productos con protenas Cortes de carne con Holiday representativegrasa. Costillas, alas de pollo, tocineta, salchicha, mortadela, salame, chinchulines, tocino, perros calientes, salchichas alemanas y embutidos envasados. Frutos secos y semillas con sal. Frijoles con sal en lata. Lcteos Leche entera o al 2%, crema, mezcla de [place]leche y crema, y queso crema. Yogur entero o endulzado. Quesos o queso azul con alto contenido de Neurosurgeongrasas. Cremas no lcteas y coberturas batidas. Quesos procesados, quesos para untar o cuajadas. Condimentos Sal de cebolla y ajo, sal condimentada, sal de mesa y sal marina. Salsas en lata y envasadas. Salsa Worcestershire. Salsa trtara. Salsa barbacoa. Salsa teriyaki. Salsa de soja, incluso la que tiene contenido reducido de [place] Eaglesodio. Salsa de carne. Salsa de pescado. Salsa de Mount Visionostras. Salsa rosada. Rbano picante. Ketchup y mostaza. Saborizantes y tiernizantes para carne. Caldo en cubitos. Salsa picante. Salsa tabasco. Adobos. Aderezos para tacos. Salsas. Grasas y [address]ace[address] [place], Indiamargarina en barra, [place]manteca de Crescentcerdo,  Earlygrasa, Singaporemantequilla clarificada y Steffanie Rainwatergrasa de tocino. Aceites de coco, de palmiste o de palma. Aderezos comunes para ensalada. Otros Pickles y Earl Parkaceitunas. Palomitas de maz y pretzels con sal. Los artculos mencionados arriba pueden no ser Raytheonuna lista completa de las bebidas y los alimentos que se Theatre stage managerdeben evitar. Comunquese con el nutricionista para obtener ms informacin. DNDE Raelyn MoraPUEDO ENCONTRAR MS INFORMACIN? Instituto Nacional del [place]orazn, del Pulmn y de la Sangre (National Heart, Lung, and Blood Institute): CablePromo.itwww.nhlbi.nih.gov/health/health-topics/topics/dash/ Document Released: 06/01/2011 Document Revised: 10/27/2013 [facility] CenterExitCare Patient Information 2015 [place]mosExitCare, MarylandLLC. This information is not intended to replace advice given to you by your health care provider. Make sure you discuss any questions you have with your health care provider.

## 2014-07-22 NOTE — Progress Notes (Signed)
Pt is here for follow-up on blood pressure. Pt did not take HCTZ this morning. She denies any chest pain, headaches, blurry vision at this time.   BP 150/85 right arm.  Interpreter in room for translation

## 2014-07-22 NOTE — Assessment & Plan Note (Addendum)
R arm numbness that comes and goes and night.  Checking vitamin B12 level.  Lay in a neutral position, on back with arms at side.

## 2014-07-22 NOTE — Assessment & Plan Note (Addendum)
1. HTN:  Goal is < 140/90. Above goal today. Well controlled labs visit.  P: Continue HCTZ 25 mg daily  Start synthroid Close f/u with RN in 2 weeks If BP elevated at f/u add ACEi, lisinopril 10 mg daily  BP Readings from Last 3 Encounters:  07/22/14 150/85  06/24/14 122/77  06/03/14 180/84

## 2014-07-23 ENCOUNTER — Telehealth: Payer: Self-pay | Admitting: *Deleted

## 2014-07-23 NOTE — Telephone Encounter (Signed)
-----   Message from Lora PaulaJosalyn C Funches, MD sent at 07/23/2014 11:04 AM EST ----- Normal vit B 12 level

## 2014-07-23 NOTE — Telephone Encounter (Signed)
Unable to LVM, voice message not set up yet 

## 2014-08-03 ENCOUNTER — Ambulatory Visit: Payer: Self-pay | Attending: Family Medicine | Admitting: *Deleted

## 2014-08-03 VITALS — BP 136/72 | HR 73 | Temp 98.1°F | Resp 16

## 2014-08-03 DIAGNOSIS — I10 Essential (primary) hypertension: Secondary | ICD-10-CM | POA: Insufficient documentation

## 2014-08-03 DIAGNOSIS — E039 Hypothyroidism, unspecified: Secondary | ICD-10-CM | POA: Insufficient documentation

## 2014-08-03 NOTE — Patient Instructions (Signed)
Plan de alimentacin DASH (DASH Eating Plan) DASH es la sigla en ingls de "Enfoques Alimentarios para Detener la Hipertensin". El plan de alimentacin DASH ha demostrado bajar la presin arterial elevada (hipertensin). Los beneficios adicionales para la salud pueden incluir la disminucin del riesgo de diabetes mellitus tipo2, enfermedades cardacas e ictus. Este plan tambin puede ayudar a adelgazar. QU DEBO SABER ACERCA DEL PLAN DE ALIMENTACIN DASH? Para el plan de alimentacin DASH, seguir las siguientes pautas generales:  Elija los alimentos con un valor porcentual diario de sodio de menos del 5% (segn figura en la etiqueta del alimento).  Use hierbas o aderezos sin sal, en lugar de sal de mesa o sal marina.  Consulte al mdico o farmacutico antes de usar sustitutos de la sal.  Coma productos con bajo contenido de sodio, cuya etiqueta suele decir "bajo contenido de sodio" o "sin agregado de sal".  Coma alimentos frescos.  Coma ms verduras, frutas y productos lcteos con bajo contenido de grasas.  Elija los cereales integrales. Busque la palabra "integral" en el primer lugar de la lista de ingredientes.  Elija el pescado y el pollo o el pavo sin piel ms a menudo que las carnes rojas. Limite el consumo de pescado, carne de ave y carne a 6onzas (170g) por da.  Limite el consumo de dulces, postres, azcares y bebidas azucaradas.  Elija las grasas saludables para el corazn.  Limite el consumo de queso a 1onza (28g) por da.  Consuma ms comida casera y menos de restaurante, de buf y comida rpida.  Limite el consumo de alimentos fritos.  Cocine los alimentos utilizando mtodos que no sean la fritura.  Limite las verduras enlatadas. Si las consume, enjuguelas bien para disminuir el sodio.  Cuando coma en un restaurante, pida que preparen su comida con menos sal o, en lo posible, sin nada de sal. QU ALIMENTOS PUEDO COMER? Pida ayuda a un nutricionista para  conocer las necesidades calricas individuales. Cereales Pan de salvado o integral. Arroz integral. Pastas de salvado o integrales. Quinua, trigo burgol y cereales integrales. Cereales con bajo contenido de sodio. Tortillas de harina de maz o de salvado. Pan de maz integral. Galletas saladas integrales. Galletas con bajo contenido de sodio. Vegetales Verduras frescas o congeladas (crudas, al vapor, asadas o grilladas). Jugos de tomate y verduras con contenido bajo o reducido de sodio. Pasta y salsa de tomate con contenido bajo o reducido de sodio. Verduras enlatadas con bajo contenido de sodio o reducido de sodio.  Frutas Frutas frescas, en conserva (en su jugo natural) o frutas congeladas. Carnes y otros productos con protenas Carne de res molida (al 85% o ms magra), carne de res de animales alimentados con pastos o carne de res sin la grasa. Pollo o pavo sin piel. Carne de pollo o de pavo molida. Cerdo sin la grasa. Todos los pescados y frutos de mar. Huevos. Porotos, guisantes o lentejas secos. Frutos secos y semillas sin sal. Frijoles enlatados sin sal. Lcteos Productos lcteos con bajo contenido de grasas, como leche descremada o al 1%, quesos reducidos en grasas o al 2%, ricota con bajo contenido de grasas o queso cottage, o yogur natural con bajo contenido de grasas. Quesos con contenido bajo o reducido de sodio. Grasas y aceites Margarinas en barra que no contengan grasas trans. Mayonesa y alios para ensaladas livianos o reducidos en grasas (reducidos en sodio). [place]. Aceites de crtamo, oliva o canola. Mantequilla natural de man o almendra. Otros Palomitas de maz y pretzels sin sal.   Los artculos mencionados arriba pueden no ser una lista completa de las bebidas o los alimentos recomendados. Comunquese con el nutricionista para conocer ms opciones. QU ALIMENTOS NO SE RECOMIENDAN? Cereales Pan blanco. Pastas blancas. Arroz blanco. Pan de maz refinado. Bagels y  croissants. Galletas saladas que contengan grasas trans. Vegetales Vegetales con crema o fritos. Verduras en salsa de queso. Verduras enlatadas comunes. Pasta y salsa de tomate en lata comunes. Jugos comunes de tomate y de verduras. Frutas Frutas secas. Fruta enlatada en almbar liviano o espeso. Jugo de frutas. Carnes y otros productos con protenas Cortes de carne con grasa. Costillas, alas de pollo, tocineta, salchicha, mortadela, salame, chinchulines, tocino, perros calientes, salchichas alemanas y embutidos envasados. Frutos secos y semillas con sal. Frijoles con sal en lata. Lcteos Leche entera o al 2%, crema, mezcla de leche y crema, y queso crema. Yogur entero o endulzado. Quesos o queso azul con alto contenido de grasas. Cremas no lcteas y coberturas batidas. Quesos procesados, quesos para untar o cuajadas. Condimentos Sal de cebolla y ajo, sal condimentada, sal de mesa y sal marina. Salsas en lata y envasadas. Salsa Worcestershire. Salsa trtara. Salsa barbacoa. Salsa teriyaki. Salsa de soja, incluso la que tiene contenido reducido de sodio. Salsa de carne. Salsa de pescado. Salsa de ostras. Salsa rosada. Rbano picante. Ketchup y mostaza. Saborizantes y tiernizantes para carne. Caldo en cubitos. Salsa picante. Salsa tabasco. Adobos. Aderezos para tacos. Salsas. Grasas y aceites Mantequilla, margarina en barra, manteca de cerdo, grasa, mantequilla clarificada y grasa de tocino. Aceites de coco, de palmiste o de palma. Aderezos comunes para ensalada. Otros Pickles y aceitunas. Palomitas de maz y pretzels con sal. Los artculos mencionados arriba pueden no ser una lista completa de las bebidas y los alimentos que se deben evitar. Comunquese con el nutricionista para obtener ms informacin. DNDE PUEDO ENCONTRAR MS INFORMACIN? [employer] [facility] (National Heart, Lung, and Blood Institute):  www.nhlbi.nih.gov/health/health-topics/topics/dash/ Document Released: 06/01/2011 Document Revised: 10/27/2013 ExitCare Patient Information 2015 [employer]. This information is not intended to replace advice given to you by your health care provider. Make sure you discuss any questions you have with your health care provider.   

## 2014-08-03 NOTE — Progress Notes (Signed)
Spoke with patient via [employer] Interpreter, Clarisa Patient presents for BP check Med list reviewed; states taking all meds as directed, however, ran out of HCTZ yesterday. Will obtain refill today and restart. Discussed need for low sodium diet and using Mrs. Dash as alternative to salt. Patient states she does not like salt. Discussed walking 30 minutes per day for exercise Patient denies headaches, blurred vision, SHOB, chest pain   C/o "mild" chest pressure lasting 30 minutes approximately once weekly. Massages area with 2 fingers and pressure leaves  BP 136/72 P 73 R 16  T  98.1 oral SPO2  96%  Per PCP: F/u with PCP in 4 weeks to f/u on chest pressure  Patient advised to call for med refills at least 7 days before running out so as not to go without.  Patient given literature on DASH Eating Plan

## 2014-09-30 ENCOUNTER — Ambulatory Visit: Payer: Self-pay | Attending: Family Medicine | Admitting: Family Medicine

## 2014-09-30 ENCOUNTER — Encounter: Payer: Self-pay | Admitting: Family Medicine

## 2014-09-30 VITALS — BP 132/72 | HR 64 | Temp 98.6°F | Resp 16 | Ht 61.0 in | Wt 154.0 lb

## 2014-09-30 DIAGNOSIS — Z114 Encounter for screening for human immunodeficiency virus [HIV]: Secondary | ICD-10-CM

## 2014-09-30 DIAGNOSIS — I10 Essential (primary) hypertension: Secondary | ICD-10-CM

## 2014-09-30 DIAGNOSIS — E039 Hypothyroidism, unspecified: Secondary | ICD-10-CM

## 2014-09-30 LAB — TSH: TSH: 0.04 u[IU]/mL — ABNORMAL LOW (ref 0.350–4.500)

## 2014-09-30 NOTE — Progress Notes (Signed)
   Subjective:    Patient ID: Caroline Oneal, female    DOB: Apr 25, 1961, [age] y.o.   MRN: [id number] CC: f/u HTN and hypothyroidism  HPI  1. CHRONIC HYPERTENSION  Disease Monitoring  Blood pressure range: not checking   Chest pain: no   Dyspnea: no   Claudication: no   Medication compliance: yes  Medication Side Effects  Lightheadedness: no   Urinary frequency: no   Edema: no   Preventitive Healthcare:   Diet Pattern: low carb   Salt Restriction: yes   2. Hypothyroidism: taking synthroid 1 hr before AM meal. ROS as per below.   Soc Hx: non smoker  Review of Systems  Constitutional: Negative for fever, chills and unexpected weight change.  HENT: Negative for sore throat and trouble swallowing.   Respiratory: Negative for shortness of breath.   Cardiovascular: Negative for chest pain and palpitations.       Objective:   Physical Exam BP 132/72 mmHg  Pulse 64  Temp(Src) 98.6 F (37 C) (Oral)  Resp 16  Ht 5\' 1"  (1.549 m)  Wt 154 lb (69.854 kg)  BMI 29.11 kg/m2  SpO2 97%  Wt Readings from Last 3 Encounters:  09/30/14 154 lb (69.854 kg)  07/22/14 159 lb 9.6 oz (72.394 kg)  06/03/14 165 lb (74.844 kg)  General appearance: alert, cooperative and no distress Neck: no adenopathy, supple, symmetrical, trachea midline and thyroid not enlarged, symmetric, no tenderness/mass/nodules Lungs: clear to auscultation bilaterally Heart: regular rate and rhythm, S1, S2 normal, no murmur, click, rub or gallop Extremities: extremities normal, atraumatic, no cyanosis or edema       Assessment & Plan:

## 2014-09-30 NOTE — Progress Notes (Signed)
F/U HTN TSH Stated did not took medication this morning  No Hx Tobacco

## 2014-09-30 NOTE — Patient Instructions (Signed)
Senora Renteria,  Thank you for coming today.  Normal exam today.  1. Hypothyroidism: Continue synthroid  Repeat TSH today  Goal TSH < 4.5  Lab Results  Component Value Date   TSH 14.523* 06/03/2014   2. HTN:  Continue HCTZ 25 mg daily  Goal BP < 140/90   F/u in 3 months   Dr. Armen PickupFunches

## 2014-09-30 NOTE — Assessment & Plan Note (Signed)
Screening HIV done today  

## 2014-09-30 NOTE — Assessment & Plan Note (Signed)
Hypothyroidism: euthyroid on exam  Continue synthroid  Repeat TSH today  Goal TSH < 4.5  Lab Results  Component Value Date   TSH 14.523* 06/03/2014

## 2014-09-30 NOTE — Assessment & Plan Note (Signed)
A: HTN: at goal Med: compliant  P: Continue HCTZ 25 mg daily  Goal BP < 140/90

## 2014-10-01 LAB — HIV ANTIBODY (ROUTINE TESTING W REFLEX): HIV 1&2 Ab, 4th Generation: NONREACTIVE

## 2014-10-01 MED ORDER — LEVOTHYROXINE SODIUM 112 MCG PO TABS: 112.0000 ug | ORAL_TABLET | Freq: Every day | ORAL | Status: DC

## 2014-10-01 NOTE — Addendum Note (Signed)
Addended by: Dessa PhiFUNCHES, Ayushi Pla on: 10/01/2014 09:22 AM   Modules accepted: Orders

## 2014-10-02 ENCOUNTER — Other Ambulatory Visit: Payer: Self-pay | Admitting: Physician Assistant

## 2014-10-02 ENCOUNTER — Telehealth: Payer: Self-pay | Admitting: Physician Assistant

## 2014-10-02 DIAGNOSIS — E038 Other specified hypothyroidism: Secondary | ICD-10-CM

## 2014-10-02 NOTE — Telephone Encounter (Signed)
-----   Message from [name] M Giraldez, Dyann KiefMA sent at 10/02/2014 11:19 AM EDT -----   ----- Message -----    From: Dessa PhiJosalyn Funches, MD    Sent: 10/01/2014   9:23 AM      To: Dyann KiefStella M Giraldez, RMA  Screening HIV negative TSH is a bit low, decrease synthroid slightly, sent in refill  Repeat TSH in 3 month

## 2014-11-09 ENCOUNTER — Ambulatory Visit: Payer: Self-pay | Attending: Family Medicine

## 2014-12-15 ENCOUNTER — Other Ambulatory Visit: Payer: Self-pay | Admitting: Family Medicine

## 2014-12-15 DIAGNOSIS — E039 Hypothyroidism, unspecified: Secondary | ICD-10-CM

## 2014-12-15 MED ORDER — LEVOTHYROXINE SODIUM 112 MCG PO TABS: 112.0000 ug | ORAL_TABLET | Freq: Every day | ORAL | Status: DC

## 2014-12-15 NOTE — Telephone Encounter (Signed)
Patient came into office requesting medication refill on levothyroxine (SYNTHROID, LEVOTHROID) 112 MCG tablet. Please f/u

## 2014-12-15 NOTE — Telephone Encounter (Signed)
Refilled

## 2015-01-13 ENCOUNTER — Ambulatory Visit: Payer: Self-pay | Attending: Family Medicine | Admitting: Family Medicine

## 2015-01-13 ENCOUNTER — Encounter: Payer: Self-pay | Admitting: Family Medicine

## 2015-01-13 VITALS — BP 132/72 | HR 57 | Temp 98.7°F | Resp 16 | Ht 61.0 in | Wt 151.0 lb

## 2015-01-13 DIAGNOSIS — J309 Allergic rhinitis, unspecified: Secondary | ICD-10-CM | POA: Insufficient documentation

## 2015-01-13 DIAGNOSIS — I10 Essential (primary) hypertension: Secondary | ICD-10-CM | POA: Insufficient documentation

## 2015-01-13 DIAGNOSIS — R7309 Other abnormal glucose: Secondary | ICD-10-CM | POA: Insufficient documentation

## 2015-01-13 DIAGNOSIS — Z0000 Encounter for general adult medical examination without abnormal findings: Secondary | ICD-10-CM | POA: Insufficient documentation

## 2015-01-13 DIAGNOSIS — E039 Hypothyroidism, unspecified: Secondary | ICD-10-CM | POA: Insufficient documentation

## 2015-01-13 LAB — TSH: TSH: 3.585 u[IU]/mL (ref 0.350–4.500)

## 2015-01-13 MED ORDER — HYDROCHLOROTHIAZIDE 25 MG PO TABS: 25.0000 mg | ORAL_TABLET | Freq: Every day | ORAL | Status: DC

## 2015-01-13 MED ORDER — CETIRIZINE HCL 10 MG PO TABS: 10.0000 mg | ORAL_TABLET | Freq: Every day | ORAL | Status: DC

## 2015-01-13 NOTE — Assessment & Plan Note (Signed)
HTN: BP at goal Med: compliant  Continue HCTZ at current dose

## 2015-01-13 NOTE — Assessment & Plan Note (Signed)
A: elevated A1c at 7 months ago P:  Repeat A1c today

## 2015-01-13 NOTE — Assessment & Plan Note (Signed)
Hypothyroidism: checking TSH today you will be called if your synthroid dose needs to changed.

## 2015-01-13 NOTE — Progress Notes (Signed)
F/U HTN Possible seasonal allergies stuffy nose sore throat x 2 week No Hx tobacco

## 2015-01-13 NOTE — Patient Instructions (Addendum)
Ms. Caroline Oneal,  Thank you for coming in today   1. HTN: BP at goal Continue HCTZ at current dose   2. Hypothyroidism: checking TSH today you will be called if your synthroid dose needs to changed.  3. Healthcare maintenance: Mammogram ordered please call to schedule GI referral for screening colonoscopy  4. Allergic rhinitis: zyrtec ordered   F/u in 4-6 weeks for pap smear  Dr. Armen PickupFunches

## 2015-01-13 NOTE — Assessment & Plan Note (Signed)
Allergic rhinitis: zyrtec ordered

## 2015-01-13 NOTE — Assessment & Plan Note (Signed)
Healthcare maintenance: Mammogram ordered please call to schedule GI referral for screening colonoscopy

## 2015-01-13 NOTE — Progress Notes (Signed)
   Subjective:    Patient ID: Caroline Oneal, female    DOB: 28-Jul-1960, 54 y.o.   MRN: [id number] CC; f/u hypothyroidism, HTN  Spanish interpreter phone used, # [phone] HPI  54 yo F female present for f.u visit   1. CHRONIC HYPERTENSION  Disease Monitoring  Blood pressure range: not checking   Chest pain: no   Dyspnea: no   Claudication: no   Medication compliance: yes  Medication Side Effects  Lightheadedness: no   Urinary frequency: no   Edema: no    Preventitive Healthcare:  Exercise: yes, walking    Diet Pattern: regular diet   Salt Restriction: yes   2.  Hypothyroidism: taking synthroid on an empty stomach. No change in weight, fatigue or edema.   3. Nasal congestion: x 2 weeks with sore throat. No fever, chills, HA, rhinorrhea. No sick contacts.   4. Prediabetes: patient walking and eating well. Has lost 8 #s since the beginning of the year. No excessive change in weight.   Soc Hx: non smoker  Review of Systems  Constitutional: Negative for fever, chills and fatigue.  HENT: Positive for congestion and sore throat. Negative for rhinorrhea, sinus pressure and sneezing.   Respiratory: Negative for cough and shortness of breath.   Cardiovascular: Negative for chest pain and palpitations.  Gastrointestinal: Positive for abdominal pain.  Skin: Negative for rash.  Psychiatric/Behavioral: Negative for dysphoric mood.       Objective:   Physical Exam BP 132/72 mmHg  Pulse 57  Temp(Src) 98.7 F (37.1 C) (Oral)  Resp 16  Ht 5\' 1"  (1.549 m)  Wt 151 lb (68.493 kg)  BMI 28.55 kg/m2  SpO2 98%  Wt Readings from Last 3 Encounters:  01/13/15 151 lb (68.493 kg)  09/30/14 154 lb (69.854 kg)  07/22/14 159 lb 9.6 oz (72.394 kg)    General appearance: alert, cooperative and no distress Head: Normocephalic, without obvious abnormality, atraumatic Nose: no discharge, turbinates pink, swollen Throat: lips, mucosa, and tongue normal; teeth and gums normal Lungs: clear to  auscultation bilaterally Heart: regular rate and rhythm, S1, S2 normal, no murmur, click, rub or gallop Extremities: extremities normal, atraumatic, no cyanosis or edema   Lab Results  Component Value Date   TSH 0.040* 09/30/2014       Assessment & Plan:

## 2015-01-14 ENCOUNTER — Telehealth: Payer: Self-pay | Admitting: *Deleted

## 2015-01-14 NOTE — Telephone Encounter (Signed)
-----   Message from Dessa Phi, MD sent at 01/14/2015  9:22 AM EDT ----- TSH normal, continue current synthroid dose

## 2015-01-14 NOTE — Telephone Encounter (Signed)
LVM to return call.

## 2015-02-10 ENCOUNTER — Ambulatory Visit: Payer: Self-pay

## 2015-02-17 ENCOUNTER — Ambulatory Visit
Admission: RE | Admit: 2015-02-17 | Discharge: 2015-02-17 | Disposition: A | Discharge status: Home / Self Care | Payer: No Typology Code available for payment source | Source: Ambulatory Visit | Attending: Family Medicine | Admitting: Family Medicine

## 2015-02-17 ENCOUNTER — Other Ambulatory Visit (HOSPITAL_COMMUNITY)
Admission: RE | Admit: 2015-02-17 | Discharge: 2015-02-17 | Disposition: A | Discharge status: Home / Self Care | Payer: No Typology Code available for payment source | Source: Ambulatory Visit | Attending: Family Medicine | Admitting: Family Medicine

## 2015-02-17 ENCOUNTER — Ambulatory Visit: Payer: No Typology Code available for payment source | Attending: Family Medicine | Admitting: Family Medicine

## 2015-02-17 ENCOUNTER — Encounter: Payer: Self-pay | Admitting: Family Medicine

## 2015-02-17 VITALS — BP 125/77 | HR 66 | Temp 99.0°F | Resp 16 | Ht 61.0 in | Wt 151.0 lb

## 2015-02-17 DIAGNOSIS — Z0000 Encounter for general adult medical examination without abnormal findings: Secondary | ICD-10-CM

## 2015-02-17 DIAGNOSIS — N760 Acute vaginitis: Secondary | ICD-10-CM | POA: Insufficient documentation

## 2015-02-17 DIAGNOSIS — Z01419 Encounter for gynecological examination (general) (routine) without abnormal findings: Secondary | ICD-10-CM | POA: Insufficient documentation

## 2015-02-17 DIAGNOSIS — Z113 Encounter for screening for infections with a predominantly sexual mode of transmission: Secondary | ICD-10-CM | POA: Insufficient documentation

## 2015-02-17 DIAGNOSIS — Z124 Encounter for screening for malignant neoplasm of cervix: Secondary | ICD-10-CM | POA: Insufficient documentation

## 2015-02-17 DIAGNOSIS — N898 Other specified noninflammatory disorders of vagina: Secondary | ICD-10-CM | POA: Insufficient documentation

## 2015-02-17 DIAGNOSIS — Z1151 Encounter for screening for human papillomavirus (HPV): Secondary | ICD-10-CM | POA: Insufficient documentation

## 2015-02-17 NOTE — Progress Notes (Signed)
   Subjective:    Patient ID: Caroline Oneal, female    DOB: March 04, 1961, 54 y.o.   MRN: [id number] CC: pap smear  HPI 54 yo F here for pap   1. Pap: has hx of normal paps. Has vaginal discharge. No vaginal pain or odor.   2. HM: has mammogram scheduled for today   Social History  Substance Use Topics  . Smoking status: Never Smoker   . Smokeless tobacco: Not on file  . Alcohol Use: No   Review of Systems  Constitutional: Negative for fever and chills.  Eyes: Negative for visual disturbance.  Respiratory: Negative for shortness of breath.   Cardiovascular: Negative for chest pain.  Gastrointestinal: Negative for abdominal pain and blood in stool.  Genitourinary: Positive for vaginal discharge. Negative for vaginal bleeding, vaginal pain, menstrual problem and pelvic pain.  Musculoskeletal: Negative for back pain and arthralgias.  Skin: Negative for rash.  Allergic/Immunologic: Negative for immunocompromised state.  Hematological: Negative for adenopathy. Does not bruise/bleed easily.  Psychiatric/Behavioral: Negative for suicidal ideas and dysphoric mood.      Objective:   Physical Exam  Constitutional: She appears well-developed and well-nourished. No distress.  Pulmonary/Chest: Effort normal.  Genitourinary: Vagina normal and uterus normal. Pelvic exam was performed with patient prone. There is no rash, tenderness or lesion on the right labia. There is no rash, tenderness or lesion on the left labia. Cervix exhibits no motion tenderness, no discharge and no friability.  Musculoskeletal: She exhibits no edema.  Lymphadenopathy:       Right: No inguinal adenopathy present.       Left: No inguinal adenopathy present.  Skin: Skin is warm and dry. No rash noted.   BP 125/77 mmHg  Pulse 66  Temp(Src) 99 F (37.2 C) (Oral)  Resp 16  Ht  (1.549 m)  Wt 151 lb (68.493 kg)  BMI 28.55 kg/m2  SpO2 97%     Assessment & Plan:

## 2015-02-17 NOTE — Patient Instructions (Addendum)
Ms. Caroline Oneal,  Thank you for coming in today Pap done today   F/u in 3 months for HTN  Dr. Armen Pickup

## 2015-02-17 NOTE — Progress Notes (Signed)
Pap smear, last pap 3 years ago with normal results  No vaginal discharge no odor Sexually active  Mammogram appointment today

## 2015-02-17 NOTE — Assessment & Plan Note (Signed)
Pap done today  

## 2015-02-18 LAB — CYTOLOGY - PAP

## 2015-02-18 LAB — CERVICOVAGINAL ANCILLARY ONLY
Chlamydia: NEGATIVE
Neisseria Gonorrhea: NEGATIVE
Wet Prep (BD Affirm): NEGATIVE

## 2015-03-03 ENCOUNTER — Telehealth: Payer: Self-pay | Admitting: Family Medicine

## 2015-03-03 NOTE — Telephone Encounter (Signed)
Patient came into facility to request her pap smear results. Please f/u with pt.

## 2015-03-04 NOTE — Telephone Encounter (Signed)
LVM to return

## 2015-03-04 NOTE — Telephone Encounter (Signed)
-----   Message from Dessa Phi, MD sent at 02/18/2015  1:35 PM EDT ----- Negative wet prep, awaiting pap results

## 2015-03-04 NOTE — Telephone Encounter (Signed)
-----   Message from Dessa Phi, MD sent at 02/18/2015  3:52 PM EDT ----- Gc/chlam negative Pap normal, repeat in 3 years

## 2015-04-07 ENCOUNTER — Telehealth: Payer: Self-pay | Admitting: Family Medicine

## 2015-04-07 NOTE — Telephone Encounter (Signed)
Patient came in requesting her pap results that were done on 02/17/15. Please f/u with pt.

## 2015-04-19 NOTE — Telephone Encounter (Signed)
LVM to return call X2

## 2015-04-26 ENCOUNTER — Telehealth: Payer: Self-pay | Admitting: Family Medicine

## 2015-04-26 NOTE — Telephone Encounter (Signed)
Patient came into facility to request results, please f/u with pt.  °

## 2015-04-28 NOTE — Telephone Encounter (Signed)
LVM to return call.

## 2015-05-03 NOTE — Telephone Encounter (Signed)
Pt. Called requesting to know her results. Please f/u with pt. °

## 2015-05-04 NOTE — Telephone Encounter (Signed)
Date of birth verified by pt  Normal pap smear GC and wep  Next pap in 3 years  Pt verbalized understanding

## 2015-05-28 ENCOUNTER — Ambulatory Visit: Payer: No Typology Code available for payment source | Attending: Family Medicine

## 2015-07-01 MED FILL — ?LEVOTHYROXINE 112 MCG TAB: 112 | 30 days supply | Qty: 30 | Fill #5

## 2015-07-01 MED FILL — HYDROCHLOROTHIAZIDE 25 MG T: 25 | 30 days supply | Qty: 30 | Fill #4

## 2015-07-19 ENCOUNTER — Ambulatory Visit: Payer: No Typology Code available for payment source | Attending: Family Medicine | Admitting: Family Medicine

## 2015-07-19 ENCOUNTER — Encounter: Payer: Self-pay | Admitting: Family Medicine

## 2015-07-19 VITALS — BP 129/74 | HR 60 | Temp 98.7°F | Resp 16 | Ht 61.0 in | Wt 161.0 lb

## 2015-07-19 DIAGNOSIS — E039 Hypothyroidism, unspecified: Secondary | ICD-10-CM | POA: Insufficient documentation

## 2015-07-19 DIAGNOSIS — Z0000 Encounter for general adult medical examination without abnormal findings: Secondary | ICD-10-CM

## 2015-07-19 DIAGNOSIS — Z79899 Other long term (current) drug therapy: Secondary | ICD-10-CM | POA: Insufficient documentation

## 2015-07-19 DIAGNOSIS — I10 Essential (primary) hypertension: Secondary | ICD-10-CM | POA: Insufficient documentation

## 2015-07-19 DIAGNOSIS — R1013 Epigastric pain: Secondary | ICD-10-CM | POA: Insufficient documentation

## 2015-07-19 DIAGNOSIS — Z1159 Encounter for screening for other viral diseases: Secondary | ICD-10-CM

## 2015-07-19 LAB — POCT GLYCOSYLATED HEMOGLOBIN (HGB A1C): Hemoglobin A1C: 6

## 2015-07-19 MED ORDER — HYDROCHLOROTHIAZIDE 25 MG PO TABS: 25.0000 mg | ORAL_TABLET | Freq: Every day | ORAL | Status: DC

## 2015-07-19 MED ORDER — LEVOTHYROXINE SODIUM 112 MCG PO TABS: 112.0000 ug | ORAL_TABLET | Freq: Every day | ORAL | Status: DC

## 2015-07-19 NOTE — Assessment & Plan Note (Signed)
Euthyroid on exam TSH today   

## 2015-07-19 NOTE — Progress Notes (Signed)
Patient ID: Caroline Oneal, female   DOB: [birth year], 55 y.o.   MRN: [id number]   Subjective:  Patient ID: Caroline Oneal, female    DOB: 1960-11-13  Age: 55 y.o. MRN: [id number]  CC: Hypertension; Hypothyroidism; and Abdominal Pain   HPI Caroline Oneal presents for the following, Spanish interpreter used   1. Hypothyroidism: compliant with meds. No HA, CP, SOB or palpitations.  2. CHRONIC HYPERTENSION  Disease Monitoring  Blood pressure range: not checking   Chest pain: no   Dyspnea: no   Claudication: no   Medication compliance: yes  Medication Side Effects  Lightheadedness: no   Urinary frequency: no   Edema: no     3. Abdominal pain: started 3 months ago. Last pain episode 1 month ago. Epigastric. Burning. No nausea, emesis or change in stools. No high dose NSAID or alcohol. She was eating spicy foods more than usual. She has not had screening colonoscopy. No family history of colon cancer.   Social History  Substance Use Topics  . Smoking status: Never Smoker   . Smokeless tobacco: Not on file  . Alcohol Use: No   Outpatient Prescriptions Prior to Visit  Medication Sig Dispense Refill  . cetirizine (ZYRTEC) 10 MG tablet Take 1 tablet (10 mg total) by mouth daily. 30 tablet 11  . hydrochlorothiazide (HYDRODIURIL) 25 MG tablet Take 1 tablet (25 mg total) by mouth daily. 30 tablet 11  . levothyroxine (SYNTHROID, LEVOTHROID) 112 MCG tablet Take 1 tablet (112 mcg total) by mouth daily before breakfast. 30 tablet 5   No facility-administered medications prior to visit.    ROS Review of Systems  Constitutional: Negative for fever and chills.  Eyes: Negative for visual disturbance.  Respiratory: Negative for shortness of breath.   Cardiovascular: Negative for chest pain.  Gastrointestinal: Positive for abdominal pain. Negative for blood in stool.  Musculoskeletal: Negative for back pain and arthralgias.  Skin: Negative for rash.  Allergic/Immunologic: Negative for  immunocompromised state.  Hematological: Negative for adenopathy. Does not bruise/bleed easily.  Psychiatric/Behavioral: Negative for suicidal ideas and dysphoric mood.    Objective:  BP 129/74 mmHg  Pulse 60  Temp(Src) 98.7 F (37.1 C) (Oral)  Resp 16  Ht  (1.549 m)  Wt 161 lb (73.029 kg)  BMI 30.44 kg/m2  SpO2 97%  BP/Weight 07/19/2015 02/17/2015 01/13/2015  Systolic BP 129 125 132  Diastolic BP 74 77 72  Wt. (Lbs) 161 151 151  BMI 30.44 28.55 28.55    Physical Exam  Constitutional: She is oriented to person, place, and time. She appears well-developed and well-nourished. No distress.  HENT:  Head: Normocephalic and atraumatic.  Cardiovascular: Normal rate, regular rhythm, normal heart sounds and intact distal pulses.   Pulmonary/Chest: Effort normal and breath sounds normal.  Abdominal: Soft. Bowel sounds are normal. She exhibits no distension and no mass. There is no tenderness. There is no rebound and no guarding.  Musculoskeletal: She exhibits no edema.  Neurological: She is alert and oriented to person, place, and time.  Skin: Skin is warm and dry. No rash noted.  Psychiatric: She has a normal mood and affect.   Lab Results  Component Value Date   HGBA1C 6.0 07/19/2015   Assessment & Plan:   Problem List Items Addressed This Visit    Hypothyroidism (Chronic)   Relevant Medications   levothyroxine (SYNTHROID, LEVOTHROID) 112 MCG tablet   Other Relevant Orders   TSH   HTN (hypertension) (Chronic)    A: Blood pressure at  goal. Meds: compliant  P: I will continue the patient's current medication regimen since her blood pressure is at goal.        Relevant Medications   hydrochlorothiazide (HYDRODIURIL) 25 MG tablet   Healthcare maintenance - Primary   Relevant Orders   POCT glycosylated hemoglobin (Hb A1C) (Completed)   Flu Vaccine QUAD 36+ mos IM (Completed)   Epigastric abdominal pain    Suspected gastritis due to foods GERD diet  provided colonoscopy recommended        Other Visit Diagnoses    Need for hepatitis C screening test        Relevant Orders    Hepatitis C antibody, reflex       No orders of the defined types were placed in this encounter.    Follow-up: No Follow-up on file.   Caroline Phi MD

## 2015-07-19 NOTE — Assessment & Plan Note (Signed)
A: Blood pressure at goal. Meds: compliant  P: I will continue the patient's current medication regimen since her blood pressure is at goal.   

## 2015-07-19 NOTE — Patient Instructions (Addendum)
Caroline Oneal was seen today for hypertension.  Diagnoses and all orders for this visit:  Healthcare maintenance -     POCT glycosylated hemoglobin (Hb A1C) -     Flu Vaccine QUAD 36+ mos IM  Hypothyroidism, unspecified hypothyroidism type -     TSH  Need for hepatitis C screening test -     Hepatitis C antibody, reflex  Epigastric abdominal pain  gastritis due to foods.    F/u in 6 months for repeat thyroid test and to further discuss screening colonoscopy. I do recommend screening colonoscopy   Dr. Armen Pickup      Opciones de alimentos para pacientes con reflujo gastroesofgico - Adultos (Food Choices for Gastroesophageal Reflux Disease, Adult) Cuando se tiene reflujo gastroesofgico (ERGE), los alimentos que se ingieren y los hbitos de alimentacin son Engineer, production. Elegir los alimentos adecuados puede ayudar a [employer].  QU PAUTAS DEBO SEGUIR?   Elija las frutas, los vegetales, los cereales integrales y los productos lcteos con bajo contenido de [place].  Elija las carnes de [place], de pescado y de ave con bajo contenido de grasas.  Limite las grasas, [address] [place], los aderezos para [place], la [place], los frutos secos y Programme researcher, broadcasting/film/video.  Lleve un registro de alimentos. Esto ayuda a identificar los alimentos que ocasionan sntomas.  Evite los alimentos que le ocasionen sntomas. Pueden ser distintos para cada persona.  Haga comidas pequeas durante Glass blower/designer de 3 comidas abundantes.  Coma lentamente, en un lugar donde est distendido.  Limite el consumo de alimentos fritos.  Cocine los alimentos utilizando mtodos que no sean la fritura.  Evite el consumo alcohol.  Evite beber grandes cantidades de lquidos con las comidas.  Evite agacharse o recostarse hasta despus de 2 o 3horas de haber comido. QU ALIMENTOS NO SE RECOMIENDAN?  Estos son algunos alimentos y bebidas que pueden empeorar los sntomas: Veterinary surgeon. Jugo de tomate. Salsa  de tomate y espagueti. Ajes. Cebolla y [place]. Rbano picante. Frutas Naranjas, pomelos y limn (fruta y [place]). Carnes Carnes de [place], de pescado y de ave con gran contenido de grasas. Esto incluye los perros calientes, las [place], el [place], la salchicha, el salame y el tocino. Lcteos Leche entera y [place]. [employer]. Crema. [place]. Helados. Queso crema.  Bebidas T o caf. Bebidas gaseosas o bebidas energizantes. Condimentos Salsa picante. Salsa barbacoa.  Dulces/postres Chocolate y cacao. [place]. Menta y mentol. Grasas y [employer]. Esto incluye las papas fritas. Otros Vinagre. Especias picantes. Esto incluye la pimienta negra, la pimienta blanca, la pimienta roja, la pimienta de cayena, el curry en polvo, los clavos de [place], el jengibre y el [place] en polvo. Esta no es [employer] de los alimentos y las bebidas que se Theatre stage manager. Comunquese con el nutricionista para recibir ms informacin.   Esta informacin no tiene Theme park manager el consejo del mdico. Asegrese de hacerle al mdico cualquier pregunta que tenga.   Document Released: 12/12/2011 Document Revised: 07/03/2014 Elsevier Interactive Patient Education 2016 [employer].   Colonoscopa (Colonoscopy) Neomia Dear colonoscopa es un examen que se realiza para examinar todo el intestino grueso (colon). Este examen puede ayudar a Engineer, manufacturing problemas, como tumores, plipos, inflamacin y reas de Restaurant manager, fast food. El examen dura aproximadamente 1hora.  INFORME A SU MDICO:   Cualquier alergia que tenga.  Todos los [employer], incluidos vitaminas, hierbas, gotas oftlmicas, cremas y [address] de [address].  Problemas previos que usted o los [address] de [place]  familia hayan tenido con el uso de anestsicos.  Enfermedades de [employer].  Cirugas previas.  Enfermedades patolgicas. RIESGOS Y COMPLICACIONES  En general, se trata de un procedimiento seguro. Sin  embargo, Tree surgeon procedimiento, pueden surgir complicaciones. Las complicaciones posibles son:  Hemorragias.  Desgarro o ruptura de la pared del colon.  Reaccin a los Systems developer.  Infeccin (raro). ANTES DEL PROCEDIMIENTO   Consulte a su mdico si debe cambiar o suspender los medicamentos que toma habitualmente.  Posiblemente se le recete una preparacin del colon por va oral. Esto incluye beber una gran cantidad de lquido medicinal desde el da anterior a su procedimiento. El lquido har que elimine muchas heces blandas hasta que sean casi claras o de color verdoso claro. De esta manera limpiar el colon para prepararlo para el procedimiento.  No coma ni beba nada ms una vez que haya comenzado con la preparacin del colon, a menos que el mdico le indique que es seguro [place].  Pdale a alguna persona que la lleve a su casa luego del procedimiento. PROCEDIMIENTO   Le administrarn un medicamento para que pueda relajarse (sedante).  Se recostar de costado con las rodillas flexionadas.  Se insertar un tubo largo y flexible con Neomia Dear luz y Neomia Dear cmara en el extremo (colonoscopio) a travs del recto y dentro del colon. La cmara enviar el video hacia una pantalla de computadora a medida que se vaya moviendo por el colon. El colonoscopio tambin libera dixido de carbono para inflar el colon. Esto ayuda a que el mdico pueda ver mejor el rea.  Durante el examen, es posible que su mdico tome una pequea muestra de tejido (biopsia) para examinarla bajo el microscopio, si se encuentran anormalidades.  El examen finaliza cuando se ha examinado todo el colon. DESPUS DEL PROCEDIMIENTO   No conduzca vehculos durante las 24horas posteriores al examen.  Es posible que encuentre una pequea cantidad de sangre en la materia fecal.  Gretchen Short tenga cantidades moderadas de gases y calambres o hinchazn abdominales leves. Esto se produce a causa  del gas utilizado para inflar el colon durante el examen.  Pregunte cundo estarn [employer] del examen y cmo los obtendr. Asegrese de Page.   Esta informacin no tiene Theme park manager el consejo del mdico. Asegrese de hacerle al mdico cualquier pregunta que tenga.   Document Released: 03/22/2005 Document Revised: 04/02/2013 Elsevier Interactive Patient Education [employer]

## 2015-07-19 NOTE — Assessment & Plan Note (Signed)
Suspected gastritis due to foods GERD diet provided colonoscopy recommended

## 2015-07-19 NOTE — Progress Notes (Signed)
F/U HTN TSH  Taking medication as prescribed  C/C abdominal pain and swelling  No pain today  No tobacco user  No suicidal thought in the past two weeks

## 2015-07-20 LAB — HEPATITIS C ANTIBODY: HCV Ab: NEGATIVE

## 2015-07-20 LAB — TSH: TSH: 6.427 u[IU]/mL — AB (ref 0.350–4.500)

## 2015-07-20 MED ORDER — LEVOTHYROXINE SODIUM 125 MCG PO TABS: 125.0000 ug | ORAL_TABLET | Freq: Every day | ORAL | Status: DC

## 2015-07-20 NOTE — Addendum Note (Signed)
Addended by: Dessa Phi on: 07/20/2015 08:25 AM   Modules accepted: Orders, SmartSet

## 2015-07-23 ENCOUNTER — Telehealth: Payer: Self-pay | Admitting: *Deleted

## 2015-07-23 NOTE — Telephone Encounter (Signed)
Unable to contact pt at this time

## 2015-07-23 NOTE — Telephone Encounter (Signed)
-----   Message from Dessa Phi, MD sent at 07/20/2015  8:24 AM EST ----- Screening Hep C negative TSH elevated patient advised to increase synthroid dose 125 mcg daily, be sure to take daily with water only, at least 30 minutes before or after any other food or medications

## 2015-07-30 ENCOUNTER — Telehealth: Payer: Self-pay | Admitting: Family Medicine

## 2015-07-30 DIAGNOSIS — S025XXA Fracture of tooth (traumatic), initial encounter for closed fracture: Secondary | ICD-10-CM

## 2015-07-30 NOTE — Telephone Encounter (Signed)
PT. Needs referral for dental, patient has loose tooth....please follow up

## 2015-08-02 NOTE — Telephone Encounter (Signed)
Dental referral placed Patient will be contacted with details

## 2015-08-04 ENCOUNTER — Ambulatory Visit: Payer: No Typology Code available for payment source | Attending: Family Medicine | Admitting: Family Medicine

## 2015-08-04 ENCOUNTER — Encounter: Payer: Self-pay | Admitting: Family Medicine

## 2015-08-04 VITALS — BP 136/81 | HR 68 | Temp 99.0°F | Resp 18 | Ht 60.0 in | Wt 165.2 lb

## 2015-08-04 DIAGNOSIS — K0889 Other specified disorders of teeth and supporting structures: Secondary | ICD-10-CM | POA: Insufficient documentation

## 2015-08-04 DIAGNOSIS — K089 Disorder of teeth and supporting structures, unspecified: Secondary | ICD-10-CM | POA: Insufficient documentation

## 2015-08-04 MED ORDER — ACETAMINOPHEN-CODEINE #3 300-30 MG PO TABS: 1.0000 | ORAL_TABLET | Freq: Four times a day (QID) | ORAL | Status: DC | PRN

## 2015-08-04 MED FILL — ACETAMINOPHEN/COD #3 TABLET: 300-30 | 5 days supply | Qty: 20 | Fill #0

## 2015-08-04 NOTE — Patient Instructions (Signed)
Fue un Research officer, trade union.    Para los problemas con la dentadura, estoy refiriendole al dentista que trabaja con el programa de la [place].   Puede tomar Tylenol para el dolor de los dientes.  Ademas, le estoy dando una receta para Tylenol con codeina ("Tylenol #3"), puede tomar 1 tableta por boca cada 6 horas segun necesite para dolor severo.   REFERRAL FOR DENTIST THAT WORKS WITH ORANGE CARD.

## 2015-08-04 NOTE — Progress Notes (Signed)
   Subjective:    Patient ID: Caroline Oneal, female    DOB: 1960-10-26, 55 y.o.   MRN: [id number]  HPI  Visit conducted in Spanish.  Patient with concerns about inferior dental issues, lost one tooth (lower incisor) and another lower incisor loose.  Patient reports that pain prevents her from eating certain foods.   ROS: Denies fevers or chills.    Social Hx; [employer]. Reports that she has never been to a dentist before.   Review of Systems     Objective:   Physical Exam Well appearing, no distress HEENT Neck supple. No cervical adenopathy. Moist mucus membranes. Poor dentition; missing inferior incisor, neighboring tooth loose. No purulence or gingival erythema. R lower molars with apparent caries.   COR regular S1S2 PULM Clear bilaterally.        Assessment & Plan:  Patient with loose and missing inferior incisors; several other teeth in poor state. No gingival redness.  Trouble eating because of dentition.  Plan to refer for dental evaluation; T#3 prn for severe pain.

## 2015-08-04 NOTE — Progress Notes (Signed)
Patient is here for Dental Referral  Patient denies any pain at this time.

## 2015-08-18 MED FILL — ?LEVOTHYROXINE 125 MCG TABL: 125 | 30 days supply | Qty: 30 | Fill #0

## 2015-08-18 MED FILL — HYDROCHLOROTHIAZIDE 25 MG T: 25 | 30 days supply | Qty: 30 | Fill #5

## 2015-09-23 MED FILL — ?LEVOTHYROXINE 125 MCG TABL: 125 | 30 days supply | Qty: 30 | Fill #1

## 2015-09-23 MED FILL — HYDROCHLOROTHIAZIDE 25 MG T: 25 | 30 days supply | Qty: 30 | Fill #6

## 2015-10-26 MED FILL — ?LEVOTHYROXINE 125 MCG TABL: 125 | 30 days supply | Qty: 30 | Fill #2

## 2015-10-26 MED FILL — ?HYDROCHLOROTHIAZIDE 25 MG: 25 MG | 30 days supply | Qty: 30 | Fill #7

## 2015-12-01 MED FILL — ?LEVOTHYROXINE 125 MCG TABL: 125 | 30 days supply | Qty: 30 | Fill #3

## 2015-12-01 MED FILL — ?HYDROCHLOROTHIAZIDE 25 MG: 25 MG | 30 days supply | Qty: 30 | Fill #8

## 2015-12-16 ENCOUNTER — Encounter: Payer: Self-pay | Admitting: Family Medicine

## 2015-12-16 ENCOUNTER — Ambulatory Visit: Payer: No Typology Code available for payment source | Attending: Family Medicine | Admitting: Family Medicine

## 2015-12-16 VITALS — BP 133/80 | HR 62 | Temp 97.4°F | Resp 16 | Ht 61.0 in | Wt 168.0 lb

## 2015-12-16 DIAGNOSIS — J309 Allergic rhinitis, unspecified: Secondary | ICD-10-CM | POA: Insufficient documentation

## 2015-12-16 DIAGNOSIS — E039 Hypothyroidism, unspecified: Secondary | ICD-10-CM | POA: Insufficient documentation

## 2015-12-16 DIAGNOSIS — I10 Essential (primary) hypertension: Secondary | ICD-10-CM | POA: Insufficient documentation

## 2015-12-16 DIAGNOSIS — M542 Cervicalgia: Secondary | ICD-10-CM | POA: Insufficient documentation

## 2015-12-16 DIAGNOSIS — Z79899 Other long term (current) drug therapy: Secondary | ICD-10-CM | POA: Insufficient documentation

## 2015-12-16 LAB — TSH: TSH: 7.4 m[IU]/L — AB

## 2015-12-16 LAB — BASIC METABOLIC PANEL WITH GFR
BUN: 9 mg/dL (ref 7–25)
CHLORIDE: 103 mmol/L (ref 98–110)
CO2: 27 mmol/L (ref 20–31)
Calcium: 9.5 mg/dL (ref 8.6–10.4)
Creat: 0.9 mg/dL (ref 0.50–1.05)
GFR, EST NON AFRICAN AMERICAN: 72 mL/min (ref >=60)
GFR, Est African American: 83 mL/min (ref >=60)
Glucose, Bld: 113 mg/dL — ABNORMAL HIGH (ref 65–99)
POTASSIUM: 3.5 mmol/L (ref 3.5–5.3)
SODIUM: 138 mmol/L (ref 135–146)

## 2015-12-16 MED ORDER — DICLOFENAC SODIUM 3 % TD GEL: 1.0000 g | Freq: Four times a day (QID) | TRANSDERMAL | Status: DC | PRN

## 2015-12-16 MED ORDER — HYDROCHLOROTHIAZIDE 25 MG PO TABS: 25.0000 mg | ORAL_TABLET | Freq: Every day | ORAL | Status: DC

## 2015-12-16 MED ORDER — CETIRIZINE HCL 10 MG PO TABS: 10.0000 mg | ORAL_TABLET | Freq: Every day | ORAL | Status: DC

## 2015-12-16 MED FILL — ?CETIRIZINE HCL 10 MG TABLE: 10 | 30 days supply | Qty: 30 | Fill #0

## 2015-12-16 MED FILL — DICLOFENAC SODIUM 3% GEL: 3 | 25 days supply | Qty: 100 | Fill #0

## 2015-12-16 MED FILL — HYDROCHLOROTHIAZIDE 25 MG T: 25 | 30 days supply | Qty: 30 | Fill #0

## 2015-12-16 NOTE — Patient Instructions (Addendum)
Caroline HesselbachMaria was seen today for hypertension and hypothyroidism.  Diagnoses and all orders for this visit:  Hypothyroidism, unspecified hypothyroidism type -     TSH  Essential hypertension -     BASIC METABOLIC PANEL WITH GFR  Posterior neck pain -     Diclofenac Sodium 3 % GEL; Place 1-2 g onto the skin 4 (four) times daily as needed.   F/u in 3 months for HTN and hypothyroidism   Dr. Armen PickupFunches   Distensin y esguince cervical con rehabilitacin (Cervical Strain and Sprain With Rehab) La distensin y el esguince cervical suelen deberse a lesiones provocadas por movimientos de Social research officer, governmentlatigazo cervical. El latigazo cervical es un movimiento de flexin del cuello hacia atrs o adelante que es brusco y Nutritional therapistenrgico, por ejemplo, durante un accidente automovilstico o mientras se practican deportes de contacto. Los msculos, los ligamentos, los tendones, los discos y los nervios del cuello son propensos a lesionarse cuando esto ocurre. FACTORES DE RIESGO El riesgo de sufrir un esguince cervical aumenta con lo siguiente:  Artrosis de columna.  Situaciones que aumentan la probabilidad de sufrir accidentes o traumatismos de la cabeza o el cuello.  Deportes de Conservator, museum/galleryalto riesgo (ftbol americano, rugby, hockey, automovilismo, gimnasia, buceo, karate de contacto o boxeo).  Poca fuerza y flexibilidad en el cuello.  Lesin previa en el cuello.  Mala tcnica de placaje.  Equipo de calce inadecuado o que no est bien acolchado. SNTOMAS   Dolor o rigidez en la parte delantera o posterior del cuello, o en ambas.  Los sntomas pueden aparecer de inmediato o en el trmino de 24horas despus de la lesin.  Mareos, dolor de Turkmenistancabeza, nuseas y vmitos.  Espasmo muscular con dolor y rigidez en el cuello.  Dolor a la palpacin e Paramedichinchazn en el lugar de la lesin. PREVENCIN  Aprenda y use las tcnicas adecuadas (no plaque ni embista con la cabeza, ni d topetazos; use las tcnicas correctas para caer a fin de  evitar caerse de cabeza).  Haga los ejercicios de precalentamiento y elongacin correctos antes de la Roscoeactividad.  Mantngase en buen estado fsico:  Earma ReadingFuerza, flexibilidad y resistencia.  Buen estado cardiovascular.  Use equipo de proteccin que calce correctamente y est bien acolchado, por ejemplo, collarines blandos acolchados, cuando practique deportes de contacto. PRONSTICO  La recuperacin de las lesiones por distensin y esguince cervical depende de la magnitud de la lesin. Generalmente, la lesin se cura en el trmino de 1semana a 3meses con el tratamiento adecuado.  COMPLICACIONES RELACIONADAS   Pueden presentarse adormecimiento y debilidad temporarios si las races nerviosas estn daadas, que pueden continuar hasta que el nervio est completamente curado.  Dolor crnico debido a la recurrencia frecuente de los sntomas.  Recuperacin prolongada, especialmente si se reanuda la Avery Dennisonactividad muy pronto (antes de la recuperacin total). TRATAMIENTO  Inicialmente, el tratamiento incluye el uso de hielo y medicamentos para ayudar a Engineer, materialsaliviar el dolor y la inflamacin. Tambin es Teacher, early years/preimportante realizar ejercicios de fortalecimiento y Therapist, nutritionalelongacin, y Biomedical engineermodificar las actividades que intensifican los sntomas para que la lesin no empeore. Estos ejercicios pueden realizarse en la casa o con un terapeuta. A los pacientes que tienen sntomas intensos, tal vez se les recomiende el uso de un collarn blando acolchado alrededor del cuello.  Mejorar la postura puede ayudar a Eastman Kodakaliviar los sntomas. La mejora de la postura incluye hundir el abdomen y el mentn mientras est de pie o sentado. Si se sienta, hgalo en una silla firme con los glteos apoyados contra el respaldo.  Mientras duerme, intente reemplazar la almohada por una toalla pequea enrollada de 2pulgadas (5centmetros) de dimetro, o use una almohada cervical o un collarn cervical blando. Las [employer] posiciones al dormir Airline pilot.   Para los pacientes que tienen dao de las races nerviosas que les causa adormecimiento o debilidad, puede ser recomendable un aparato de traccin cervical. En contadas ocasiones, se debe realizar una ciruga para tratar estas lesiones. Sin embargo, es posible que la distensin y los esguinces cervicales que estn presentes al nacer (congnitos) requieran [place]. MEDICAMENTOS   Si se necesitan analgsicos, a menudo se recomiendan los antiinflamatorios no esteroides, como la aspirina y el ibuprofeno, u otros analgsicos suaves, como el paracetamol.  No tome analgsicos durante 7das antes de la [place].  Se pueden administrar analgsicos recetados si el mdico lo considera necesario. Utilcelos como se le indique y solo cuando lo necesite. CALOR Y FRO:   El tratamiento confro (aplicacin de hielo) Printmaker dolor y reduce la inflamacin. Este se debe aplicar durante 10 a cada 2 o 3horas para la inflamacin y Chief Technology Officer, e inmediatamente despus de Education officer, environmental cualquier actividad que intensifique los sntomas. Use bolsas de hielo o un masaje con hielo.  Se puede usar Engineer, water antes de Education officer, environmental las actividades de elongacin y fortalecimiento indicadas por el mdico, el fisioterapeuta o Orthoptist. Pngase una compresa caliente o dese un bao tibio de inmersin. SOLICITE ATENCIN MDICA SI:   Los sntomas empeoran o no mejoran en 2semanas, a pesar de Medical illustrator.  Presenta sntomas nuevos sin motivo aparente (los medicamentos utilizados durante el tratamiento pueden producir [place]). EJERCICIOS EJERCICIOS DE AMPLITUD DE MOVIMIENTOS Y DE ELONGACIN: distensin y esguince cervical Estos ejercicios pueden ser de ayuda al comenzar la rehabilitacin de la lesin. Para que los sntomas se resuelvan satisfactoriamente, debe mejorar la postura. Estos ejercicios estn diseados para ayudar a Armed forces technical officer de la cabeza hacia adelante y protraccin de  los hombros, la cual contribuye a Copy. Los sntomas pueden resolverse con o sin mayor intervencin del mdico, el fisioterapeuta o Orthoptist. Mientras realiza estos ejercicios, recuerde lo siguiente:   Al restablecer la flexibilidad de los tejidos, las articulaciones recuperan el movimiento normal, lo que permite movimientos y actividades ms dinmicos y con Psychologist, educational.  La elongacin eficaz se debe mantener durante por lo menos 20segundos, aunque tal vez deba comenzar con sesiones de [place] para su comodidad.  La elongacin nunca debe ser dolorosa. Solo debe sentir un estiramiento o aflojamiento suave en tejido en elongacin. ELONGACIN: extensores axiales  Acustese en el piso boca arriba. Puede flexionar las rodillas para estar cmodo. Coloque un toalla de mano o un repasador enrollado, de unas 2pulgadas (5centmetros) de dimetro, debajo de la zona de la cabeza que est apoyada sobre el piso.  Suavemente hunda el [place], como si intentara formar una papada, [place] sentir una leve elongacin en la base de la cabeza.  Mantenga la posicin durante __________segundos. Repita __________veces. Haga este ejercicio __________veces por da.  ELONGACIN: extensin axial  Prese o sintese sobre una superficie firme. Adopte una postura correcta: el pecho erguido, los hombros [employer], los msculos abdominales apenas tensos, las rodillas sin trabar (si est de pie) y los pies separados al ancho las caderas.  Con un movimiento lento, lleve el [employer], de modo que la cabeza se deslice hacia atrs y el mentn baje levemente. Siga mirando hacia adelante.  Debe sentir una elongacin [place] parte  posterior de la cabeza. Tenga presente que la elongacin no tiene que ser brusca ya que esto puede causar dolores de cabeza ms tarde.  Mantenga la posicin durante __________segundos. Repita __________veces. Haga este ejercicio __________veces por da. ELONGACIN:  flexin cervical lateral   Prese o sintese sobre una superficie firme. Adopte una postura correcta: el pecho erguido, los hombros [employer], los msculos abdominales apenas tensos, las rodillas sin trabar (si est de pie) y los pies separados al ancho las caderas.  Sin mover la nariz ni los hombros, lentamente deje caer la oreja derecha / izquierdo hacia el hombro hasta sentir la elongacin suave de los msculos del lado contrario del cuello.  Mantenga la posicin durante __________segundos. Repita __________veces. Haga este ejercicio __________veces por da. ELONGACIN: rotadores cervicales   Prese o sintese sobre una superficie firme. Adopte una postura correcta: el pecho erguido, los hombros [employer], los msculos abdominales apenas tensos, las rodillas sin trabar (si est de pie) y los pies separados al ancho las caderas.  Con los ojos nivelados con el piso, gire lentamente la cabeza hasta sentir una elongacin [place] a lo largo de la espalda y el lado opuesto del cuello.  Mantenga la posicin durante __________segundos. Repita __________veces. Haga este ejercicio __________veces por da. AMPLITUD DE MOVIMIENTOS: crculos con el cuello   Prese o sintese sobre una superficie firme. Adopte una postura correcta: el pecho erguido, los hombros [employer], los msculos abdominales apenas tensos, las rodillas sin trabar (si est de pie) y los pies separados al ancho las caderas.  Suavemente baje la cabeza y haga movimientos circulares desde la parte posterior de un hombro hasta la parte posterior del [place]. El movimiento nunca debe ser forzado ni doloroso.  Repita el movimiento 10 o 20veces, o hasta que sienta que los msculos del cuello se Banker y se aflojan. Repita __________veces. Haga el ejercicio __________veces por da. EJERCICIOS DE FORTALECIMIENTO: distensin y esguince cervical Estos ejercicios pueden ser de ayuda al comenzar la rehabilitacin de la lesin. Estos  pueden resolver los sntomas con o sin mayor intervencin del mdico, el fisioterapeuta o Orthoptist. Mientras realiza estos ejercicios, recuerde lo siguiente:   Los msculos pueden adquirir la resistencia y la fuerza necesarias para las actividades cotidianas a travs de ejercicios controlados.  Realice estos ejercicios como se lo hayan indicado el mdico, el fisioterapeuta o Orthoptist. Aumente la resistencia y las repeticiones solo como se lo hayan indicado.  Puede tener dolor o fatiga muscular; sin embargo, Chief Technology Officer o las molestias que intenta eliminar nunca deben intensificarse durante la realizacin de estos ejercicios. Si el dolor se intensifica, detngase y asegrese de estar siguiendo las indicaciones de Advice worker. Si an siente dolor despus de los ajustes, deje de hacer el ejercicio hasta tanto pueda analizar el problema con el mdico. FUERZA: flexores cervicales, isomtrico   Prese de frente a una pared a una distancia aproximada de 6pulgadas (15centmetros). Coloque una almohada pequea, una pelota de unas 6 a 8pulgadas (15 a 20centmetros) de dimetro o una toalla doblada entre la frente y la pared.  Hunda levemente el mentn y, con [place], empuje el objeto blando con la frente. La intensidad del empuje debe ser leve a moderada, y la tensin debe aumentarse de manera gradual. Mantenga relajadas la mandbula y la frente.  Mantenga la posicin durante 10 a 20segundos. Respire tranquilo.  Disminuya lentamente la tensin. Relaje los msculos del cuello por completo antes de comenzar la siguiente repeticin. Repita __________veces. Haga este ejercicio __________veces  por Futures traderda. FUERZA: flexores cervicales laterales, isomtrico   Prese a una distancia aproximada de 6pulgadas (15centmetros) de una pared. Coloque una almohada pequea, una pelota de unas 6 a 8pulgadas (15 a 20centmetros) de Probation officerdimetro o una toalla doblada entre el costado de la cabeza y la  pared.  Hunda levemente el mentn y, con Eddyvillesuavidad, empuje el objeto blando con la Turkmenistancabeza. La intensidad del empuje debe ser leve a moderada, y la tensin debe aumentarse de manera gradual. Mantenga relajadas la mandbula y la frente.  Mantenga la posicin durante 10 a 20segundos. Respire tranquilo.  Disminuya lentamente la tensin. Relaje los msculos del cuello por completo antes de comenzar la siguiente repeticin. Repita __________veces. Haga este ejercicio __________veces por da. FUERZA: extensores cervicales, isomtrico  Prese a una distancia aproximada de 6pulgadas (15centmetros) de una pared. Coloque una almohada pequea, una pelota de unas 6 a 8pulgadas (15 a 20centmetros) de dimetro o una toalla doblada entre la zona posterior de la cabeza y la pared.  Hunda levemente el mentn y, con Benbowsuavidad, empuje el objeto blando con la parte posterior de la cabeza. La intensidad del empuje debe ser leve a moderada, y la tensin debe aumentarse de manera gradual. Mantenga relajadas la mandbula y la frente.  Mantenga la posicin durante 10 a 20segundos. Respire tranquilo.  Disminuya lentamente la tensin. Relaje los msculos del cuello por completo antes de comenzar la siguiente repeticin. Repita __________veces. Haga este ejercicio __________veces por da. CONSIDERACIONES ACERCA DE LA POSTURA Y LA MECNICA DEL CUERPO: distensin y esguince cervical Mantener una postura correcta mientras est sentado, de pie o realizando sus actividades reducir la tensin en los diferentes tejidos del cuerpo, lo que permitir la recuperacin de los tejidos lesionados y la disminucin de las experiencias que Teaching laboratory techniciancausan dolor. A continuacin se incluyen pautas generales para mejorar la postura. El mdico o el fisioterapeuta le darn indicaciones especficas para sus necesidades. Mientras lea estas pautas, recuerde lo siguiente:  Los ejercicios que el mdico le indique lo ayudarn a Public librariantener la flexibilidad  y la fuerza para Pharmacologistmantener las posturas correctas.  La postura correcta ofrece a las articulaciones el entorno ptimo para su funcionamiento. Todas las articulaciones sufren un desgaste menor cuando la columna est en la postura correcta y brinda un sostn adecuado. Esto significa un cuerpo ms sano y con Exelon Corporationmenos dolores.  En todas las actividades, la postura debe ser la correcta, especialmente cuando est sentado o de pie. La postura correcta es igual de importante cuando realiza actividades repetitivas con bajo nivel de tensin (tipear) y cuando lleva a cabo una nica actividad con cargas pesadas (levantar objetos). DE PIE DURANTE MUCHO TIEMPO E INCLINADO LEVEMENTE HACIA ADELANTE  Cuando realice una tarea que le exija inclinarse hacia adelante mientras est de pie en un lugar durante mucho tiempo, apoye un pie sobre un objeto inmvil que tenga una altura de 2 a 4pulgadas (5 a 10centmetros), para Conservation officer, historic buildingsmantener la mejor postura. Cuando ambos pies estn apoyados en el piso, la parte baja de la espalda tiende a perder la curvatura leve que tiene Graceyhacia adentro. Si esta curva se aplana (o se vuelve muy pronunciada), aumentar mucho la tensin sobre la espalda y las dems articulaciones, se fatigarn con mayor rapidez y tal vez le causen Engineer, miningdolor.  POSICIONES DE DESCANSO Tenga en cuentas las posiciones que ms dolor le causan cuando elija una de descanso. Si las SUPERVALU INCactividades que le exigen flexionarse (sentarse, agacharse, encorvarse, Dynegyponerse en cuclillas) le Teaching laboratory techniciancausan dolor, opte por una posicin que le permita  descansar en una postura menos flexionada. No se curve en posicin fetal de costado. Si el dolor se intensifica con las SUPERVALU INC exigen extenderse (estar de pie durante mucho tiempo, trabajar con las manos por encima de la cabeza), no descanse en una posicin extendida, por ejemplo, dormir boca abajo. La mayora de las personas estarn ms cmodas cuando descansen con la columna vertebral en una posicin ms  neutral, ni muy curvada ni muy arqueada. Con frecuencia, se sentir ms aliviado si se acuesta de costado en una cama que no se hunda con una [employer], o boca arriba con una almohada debajo de las rodillas. Recuerde Chief Technology Officer en una sola posicin durante [employer], sin importar si la postura es [place], puede causar rigidez. CAMINAR [place] erguido. Las [place], los hombros y las caderas deben estar alineados. TRABAJO DE OFICINA Si trabaja en un escritorio, cree un entorno que le permita mantener una buena postura erguida. Sin soporte adicional, los msculos se fatigan y causan una tensin excesiva en las articulaciones y otros tejidos. SILLA:  La silla debe poder deslizarse por debajo del escritorio cuando apoye la espalda en el respaldo. Esto le permite trabajar ms cerca.  La altura de la silla debe permitirle que los ojos estn nivelados con la parte superior del monitor y las manos estn apenas ms abajo que los codos.  Posicin del cuerpo:  Debe tener los pies apoyados en el piso. Si no es posible, use un posapies.  Mantenga las orejas por encima de los hombros. Esto reducir la tensin en el cuello y la cintura.   Esta informacin no tiene Theme park manager el consejo del mdico. Asegrese de hacerle al mdico cualquier pregunta que tenga.   Document Released: 03/29/2006 Document Revised: 07/03/2014 Elsevier Interactive Patient Education [employer]

## 2015-12-16 NOTE — Progress Notes (Signed)
Subjective:  Patient ID: Caroline Oneal, female    DOB: [birth year]  Age: 55 y.o. MRN: [id number] Spanish interpreter [phone] Clovis FredricksonFransisco  CC: Hypertension and Hypothyroidism   HPI Caroline Oneal presents for   1. HTN: taking HCTZ 25 mg daily. No HA, CP, SOB. Some neck pain. Some blurry vision. She is eating low salt diet. She is not exercising.   2. Hypothyroidism: taking synthroid daily. No palpitations. No swelling in legs or hands.   3. Neck pain: x 3 months. Posterior neck pain. Constant. 6/10 pain. Unable to characterize pain. Pain involves posterior neck and upper back. Pain does not radiate to head or down arms. She denies heavy lifting.   Social History  Substance Use Topics  . Smoking status: Never Smoker   . Smokeless tobacco: Not on file  . Alcohol Use: No    Outpatient Prescriptions Prior to Visit  Medication Sig Dispense Refill  . cetirizine (ZYRTEC) 10 MG tablet Take 1 tablet (10 mg total) by mouth daily. 30 tablet 11  . hydrochlorothiazide (HYDRODIURIL) 25 MG tablet Take 1 tablet (25 mg total) by mouth daily. 30 tablet 11  . levothyroxine (SYNTHROID, LEVOTHROID) 125 MCG tablet Take 1 tablet (125 mcg total) by mouth daily before breakfast. 30 tablet 3  . acetaminophen-codeine (TYLENOL #3) 300-30 MG tablet Take 1 tablet by mouth every 6 (six) hours as needed for moderate pain. (Patient not taking: Reported on 12/16/2015) 20 tablet 0   No facility-administered medications prior to visit.    ROS Review of Systems  Constitutional: Negative for fever and chills.  Eyes: Positive for visual disturbance.  Respiratory: Negative for shortness of breath.   Cardiovascular: Negative for chest pain.  Gastrointestinal: Positive for abdominal pain. Negative for blood in stool.  Musculoskeletal: Positive for neck pain. Negative for back pain and arthralgias.  Skin: Negative for rash.  Allergic/Immunologic: Negative for immunocompromised state.  Hematological: Negative for  adenopathy. Does not bruise/bleed easily.  Psychiatric/Behavioral: Negative for suicidal ideas and dysphoric mood.    Objective:  BP 133/80 mmHg  Pulse 62  Temp(Src) 97.4 F (36.3 C) (Oral)  Resp 16  Ht 5\' 1"  (1.549 m)  Wt 168 lb (76.204 kg)  BMI 31.76 kg/m2  SpO2 97%  BP/Weight 12/16/2015 08/04/2015 07/19/2015  Systolic BP 133 136 129  Diastolic BP 80 81 74  Wt. (Lbs) 168 165.2 161  BMI 31.76 32.26 30.44   Physical Exam  Constitutional: She is oriented to person, place, and time. She appears well-developed and well-nourished. No distress.  HENT:  Head: Normocephalic and atraumatic.  Neck: Normal range of motion. Neck supple. Muscular tenderness present.    Cardiovascular: Normal rate, regular rhythm, normal heart sounds and intact distal pulses.   Pulmonary/Chest: Effort normal and breath sounds normal.  Abdominal: Soft. Bowel sounds are normal. She exhibits no distension and no mass. There is no tenderness. There is no rebound and no guarding.  Musculoskeletal: She exhibits no edema.  Neurological: She is alert and oriented to person, place, and time.  Skin: Skin is warm and dry. No rash noted.  Psychiatric: She has a normal mood and affect.   Lab Results  Component Value Date   TSH 6.427* 07/19/2015   Lab Results  Component Value Date   HGBA1C 6.0 07/19/2015     Assessment & Plan:   Caroline Oneal was seen today for hypertension and hypothyroidism.  Diagnoses and all orders for this visit:  Hypothyroidism, unspecified hypothyroidism type -     TSH  Essential  hypertension -     BASIC METABOLIC PANEL WITH GFR -     hydrochlorothiazide (HYDRODIURIL) 25 MG tablet; Take 1 tablet (25 mg total) by mouth daily.  Posterior neck pain -     Diclofenac Sodium 3 % GEL; Place 1-2 g onto the skin 4 (four) times daily as needed.  Allergic rhinitis, unspecified allergic rhinitis type -     cetirizine (ZYRTEC) 10 MG tablet; Take 1 tablet (10 mg total) by mouth daily.    No  orders of the defined types were placed in this encounter.    Follow-up: Return in about 3 months (around 03/17/2016) for HTN and hypothyroidism .   Dessa PhiJosalyn Edda Orea MD

## 2015-12-16 NOTE — Progress Notes (Signed)
F/U HTN  Taking medication as prescribed  Pain scale #6 neck pain  No suicidal thoughts in the past two weeks  No tobacco user

## 2015-12-19 MED ORDER — LEVOTHYROXINE SODIUM 137 MCG PO TABS: 137.0000 ug | ORAL_TABLET | Freq: Every day | ORAL | Status: DC

## 2015-12-19 NOTE — Assessment & Plan Note (Addendum)
A: TSH elevated on synthroid Lab Results  Component Value Date   TSH 7.40* 12/16/2015   P: Continue synthroid increase dose from 125 mcg to 137 mcg daily

## 2015-12-19 NOTE — Assessment & Plan Note (Signed)
MSK neck pain Add 3% voltaren gel topical

## 2015-12-19 NOTE — Assessment & Plan Note (Signed)
A: Blood pressure at goal. Meds: compliant  P: I will continue the patient's current medication regimen since her blood pressure is at goal.   

## 2015-12-20 ENCOUNTER — Telehealth: Payer: Self-pay | Admitting: Family Medicine

## 2015-12-20 MED FILL — ?LEVOTHYROXINE 137 MCG TAB: 137 | 30 days supply | Qty: 30 | Fill #0

## 2015-12-20 NOTE — Telephone Encounter (Signed)
Called pt via pacific interpreter CraigMarina  ID [id number]. Reached voicemail. Left message stating for patient to return call at [phone].

## 2015-12-20 NOTE — Telephone Encounter (Signed)
-----   Message from Dessa PhiJosalyn Funches, MD sent at 12/20/2015  8:35 AM EDT ----- Elevated TSH Synthroid dose increased to 137 mcg daily Repeat level needed in 12 weeks Normal BMP

## 2015-12-21 NOTE — Telephone Encounter (Signed)
Called pt via pacific interpreter [place], [place]# [phone]. Reached voicemail. Left message to return call at [phone].

## 2015-12-24 NOTE — Telephone Encounter (Signed)
Called pt via Temple-InlandPacific Interpreter Angelica, [id number]. Reached voicemail. Left message to return call at [phone].

## 2015-12-24 NOTE — Telephone Encounter (Signed)
-----   Message from Josalyn Funches, MD sent at 12/20/2015  8:35 AM EDT ----- Elevated TSH Synthroid dose increased to 137 mcg daily Repeat level needed in 12 weeks Normal BMP 

## 2016-02-02 ENCOUNTER — Ambulatory Visit: Payer: No Typology Code available for payment source

## 2016-02-10 ENCOUNTER — Ambulatory Visit: Payer: No Typology Code available for payment source | Attending: Internal Medicine

## 2016-02-22 MED FILL — ?CETIRIZINE HCL 10 MG TABLE: 10 | 30 days supply | Qty: 30 | Fill #1

## 2016-02-22 MED FILL — HYDROCHLOROTHIAZIDE 25 MG T: 25 | 30 days supply | Qty: 30 | Fill #1

## 2016-03-03 ENCOUNTER — Encounter: Payer: Self-pay | Admitting: Family Medicine

## 2016-03-03 ENCOUNTER — Ambulatory Visit: Payer: Self-pay | Attending: Family Medicine | Admitting: Family Medicine

## 2016-03-03 VITALS — BP 134/80 | HR 60 | Temp 98.7°F | Wt 170.6 lb

## 2016-03-03 DIAGNOSIS — E039 Hypothyroidism, unspecified: Secondary | ICD-10-CM | POA: Insufficient documentation

## 2016-03-03 DIAGNOSIS — I10 Essential (primary) hypertension: Secondary | ICD-10-CM | POA: Insufficient documentation

## 2016-03-03 DIAGNOSIS — Z23 Encounter for immunization: Secondary | ICD-10-CM | POA: Insufficient documentation

## 2016-03-03 DIAGNOSIS — Z79899 Other long term (current) drug therapy: Secondary | ICD-10-CM | POA: Insufficient documentation

## 2016-03-03 LAB — BASIC METABOLIC PANEL WITH GFR
BUN: 8 mg/dL (ref 7–25)
CALCIUM: 9.7 mg/dL (ref 8.6–10.4)
CO2: 29 mmol/L (ref 20–31)
Chloride: 105 mmol/L (ref 98–110)
Creat: 0.66 mg/dL (ref 0.50–1.05)
Glucose, Bld: 105 mg/dL — ABNORMAL HIGH (ref 65–99)
Potassium: 3.6 mmol/L (ref 3.5–5.3)
SODIUM: 142 mmol/L (ref 135–146)

## 2016-03-03 LAB — TSH: TSH: 6.14 mIU/L — ABNORMAL HIGH

## 2016-03-03 MED ORDER — LEVOTHYROXINE SODIUM 137 MCG PO TABS: 137.0000 ug | ORAL_TABLET | Freq: Every day | ORAL | 3 refills | Status: DC

## 2016-03-03 NOTE — Patient Instructions (Addendum)
Caroline Oneal was seen today for hypothyroidism and hypertension.  Diagnoses and all orders for this visit:  Hypothyroidism, unspecified hypothyroidism type -     TSH -     Discontinue: levothyroxine (SYNTHROID, LEVOTHROID) 137 MCG tablet; Take 1 tablet (137 mcg total) by mouth daily before breakfast. -     levothyroxine (SYNTHROID, LEVOTHROID) 150 MCG tablet; Take 1 tablet (150 mcg total) by mouth daily before breakfast.  Essential hypertension -     BASIC METABOLIC PANEL WITH GFR  Encounter for immunization -     Flu Vaccine QUAD 36+ mos IM   F/u in 3 months for hypothyroidism   Dr. Armen PickupFunches

## 2016-03-03 NOTE — Progress Notes (Signed)
Subjective:  Patient ID: Caroline Oneal, female    DOB: April 29, 1961  Age: 55 y.o. MRN: [id number]   CC: Hypothyroidism and Hypertension   HPI Caroline Oneal presents with her daughter  for   1. HTN: taking HCTZ 25 mg daily. No HA, CP, SOB. No neck pain. No vision problems. She is eating low salt diet. She is not exercising.   2. Hypothyroidism: taking synthroid daily. She is not sure of her current dose. She last picked up  refill about 1 month ago. No fatigue, weight gain, constipation.  No palpitations. No swelling in legs or hands.    Social History  Substance Use Topics  . Smoking status: Never Smoker  . Smokeless tobacco: Not on file  . Alcohol use No    Outpatient Medications Prior to Visit  Medication Sig Dispense Refill  . cetirizine (ZYRTEC) 10 MG tablet Take 1 tablet (10 mg total) by mouth daily. 30 tablet 11  . Diclofenac Sodium 3 % GEL Place 1-2 g onto the skin 4 (four) times daily as needed. 100 g 3  . hydrochlorothiazide (HYDRODIURIL) 25 MG tablet Take 1 tablet (25 mg total) by mouth daily. 30 tablet 11  . levothyroxine (SYNTHROID, LEVOTHROID) 137 MCG tablet Take 1 tablet (137 mcg total) by mouth daily before breakfast. 30 tablet 3   No facility-administered medications prior to visit.     ROS Review of Systems  Constitutional: Negative for chills and fever.  Eyes: Negative for visual disturbance.  Respiratory: Negative for shortness of breath.   Cardiovascular: Negative for chest pain.  Gastrointestinal: Negative for abdominal pain and blood in stool.  Musculoskeletal: Negative for arthralgias, back pain and neck pain.  Skin: Negative for rash.  Allergic/Immunologic: Negative for immunocompromised state.  Hematological: Negative for adenopathy. Does not bruise/bleed easily.  Psychiatric/Behavioral: Negative for dysphoric mood and suicidal ideas.    Objective:  BP 134/80 (BP Location: Right Arm, Patient Position: Sitting, Cuff Size: Small)   Pulse 60    Temp 98.7 F (37.1 C) (Oral)   Wt 170 lb 9.6 oz (77.4 kg)   SpO2 96%   BMI 32.23 kg/m   BP/Weight 03/03/2016 12/16/2015 08/04/2015  Systolic BP 134 133 136  Diastolic BP 80 80 81  Wt. (Lbs) 170.6 168 165.2  BMI 32.23 31.76 32.26   Physical Exam  Constitutional: She is oriented to person, place, and time. She appears well-developed and well-nourished. No distress.  HENT:  Head: Normocephalic and atraumatic.  Neck: Normal range of motion. Neck supple. No muscular tenderness present. No thyromegaly present.  Cardiovascular: Normal rate, regular rhythm, normal heart sounds and intact distal pulses.   Pulmonary/Chest: Effort normal and breath sounds normal.  Abdominal: Soft. Bowel sounds are normal. She exhibits no distension and no mass. There is no tenderness. There is no rebound and no guarding.  Musculoskeletal: She exhibits no edema.  Neurological: She is alert and oriented to person, place, and time.  Skin: Skin is warm and dry. No rash noted.  Psychiatric: She has a normal mood and affect.   Lab Results  Component Value Date   TSH 6.14 (H) 03/03/2016   Lab Results  Component Value Date   HGBA1C 6.0 07/19/2015     Assessment & Plan:   Caroline Oneal was seen today for hypothyroidism and hypertension.  Diagnoses and all orders for this visit:  Hypothyroidism, unspecified hypothyroidism type -     TSH -     levothyroxine (SYNTHROID, LEVOTHROID) 137 MCG tablet; Take 1 tablet (137  mcg total) by mouth daily before breakfast.  Essential hypertension -     BASIC METABOLIC PANEL WITH GFR  Encounter for immunization -     Flu Vaccine QUAD 36+ mos IM    No orders of the defined types were placed in this encounter.   Follow-up: Return in about 3 months (around 06/02/2016) for hypothyroidism .   Dessa Phi MD

## 2016-03-04 MED ORDER — LEVOTHYROXINE SODIUM 150 MCG PO TABS: 150.0000 ug | ORAL_TABLET | Freq: Every day | ORAL | 3 refills | Status: DC

## 2016-03-04 NOTE — Assessment & Plan Note (Signed)
A: elevated TSH P: Increase synthroid from 137 to 150 mcg daily  Repeat in 12 weeks

## 2016-03-04 NOTE — Assessment & Plan Note (Signed)
Well-controlled.  Continue current regimen. 

## 2016-03-10 ENCOUNTER — Telehealth: Payer: Self-pay

## 2016-03-10 NOTE — Telephone Encounter (Signed)
VM was left for pt to return the phone call.

## 2016-03-22 ENCOUNTER — Telehealth: Payer: Self-pay

## 2016-03-22 NOTE — Telephone Encounter (Signed)
Pt was called on 9/27 a VM was left for her to return phone call. Results will be mailed out on 9/27.

## 2016-03-23 MED FILL — LEVOTHYROXINE 150 MCG TAB: 150 | 30 days supply | Qty: 30 | Fill #0

## 2016-03-27 MED FILL — HYDROCHLOROTHIAZIDE 25 MG T: 25 | 30 days supply | Qty: 30 | Fill #2

## 2016-05-01 MED FILL — LEVOTHYROXINE 150 MCG TAB: 150 | 30 days supply | Qty: 30 | Fill #1

## 2016-05-01 MED FILL — HYDROCHLOROTHIAZIDE 25 MG T: 25 | 30 days supply | Qty: 30 | Fill #3

## 2016-06-13 MED FILL — LEVOTHYROXINE 150 MCG TAB: 150 | 30 days supply | Qty: 30 | Fill #2

## 2016-06-13 MED FILL — HYDROCHLOROTHIAZIDE 25 MG T: 25 | 30 days supply | Qty: 30 | Fill #4

## 2016-06-27 ENCOUNTER — Encounter: Payer: Self-pay | Admitting: Family Medicine

## 2016-06-27 ENCOUNTER — Ambulatory Visit: Payer: Self-pay | Attending: Family Medicine | Admitting: Family Medicine

## 2016-06-27 VITALS — BP 142/80 | HR 65 | Temp 98.1°F | Ht 61.0 in | Wt 166.4 lb

## 2016-06-27 DIAGNOSIS — E039 Hypothyroidism, unspecified: Secondary | ICD-10-CM | POA: Insufficient documentation

## 2016-06-27 DIAGNOSIS — Z79899 Other long term (current) drug therapy: Secondary | ICD-10-CM | POA: Insufficient documentation

## 2016-06-27 DIAGNOSIS — S8392XA Sprain of unspecified site of left knee, initial encounter: Secondary | ICD-10-CM | POA: Insufficient documentation

## 2016-06-27 DIAGNOSIS — W010XXA Fall on same level from slipping, tripping and stumbling without subsequent striking against object, initial encounter: Secondary | ICD-10-CM | POA: Insufficient documentation

## 2016-06-27 DIAGNOSIS — Z23 Encounter for immunization: Secondary | ICD-10-CM

## 2016-06-27 MED ORDER — QUICK-FIT CRUTCHES MISC: 2.0000 | Freq: Every day | 0 refills | Status: DC

## 2016-06-27 MED ORDER — NAPROXEN 500 MG PO TABS: 500.0000 mg | ORAL_TABLET | Freq: Two times a day (BID) | ORAL | 0 refills | Status: DC

## 2016-06-27 MED ORDER — "ADJUSTABLE ALUMINUM CANE 3/4"" MISC": 1.0000 | Freq: Every day | 0 refills | Status: DC

## 2016-06-27 NOTE — Assessment & Plan Note (Signed)
A: L knee pain P: Ice for 20 minutes 2-3 times per day Wear knee brace during the day for next 3 weeks  Take naproxen for pain Use crutches or cane to offset weight until pain improves  X-ray F/u in 6 weeks

## 2016-06-27 NOTE — Assessment & Plan Note (Signed)
Lab Results  Component Value Date   TSH 6.14 (H) 03/03/2016   Checking TSH today Continue synthroid 150 mg daily for now

## 2016-06-27 NOTE — Patient Instructions (Addendum)
Caroline Oneal was seen today for hypothyroidism.  Diagnoses and all orders for this visit:  Hypothyroidism, unspecified type -     TSH  Sprain of left knee, unspecified ligament, initial encounter -     naproxen (NAPROSYN) 500 MG tablet; Take 1 tablet (500 mg total) by mouth 2 (two) times daily with a meal. -     DG Knee AP/LAT W/Sunrise Left; Future -     Misc. Devices (QUICK-FIT CRUTCHES) MISC; 2 each by Does not apply route daily. -     Misc. Devices (ADJUSTABLE ALUMINUM CANE 3/4") MISC; 1 each by Does not apply route daily.   Ice for 20 minutes 2-3 times per day Wear knee brace during the day for next 3 weeks  Take naproxen for pain Use crutches or cane to offset weight until pain improves    F/u in 6 weeks for L knee pain   Dr. Armen Pickup  Esguince combinado del ligamento de la rodilla (Combined Knee Ligament Sprain) Un ligamento es un tipo de tejido fibroso que conecta un hueso con Therapist, art. La rodilla tiene cuatro ligamentos. Juntos, proporcionan estabilidad a la articulacin de la rodilla. Un esguince combinado del ligamento de la rodilla es una lesin en la que ms de un ligamento se encuentra gravemente distendido o desgarrado. Este tipo de lesin tambin se llama lesin de estructuras mltiples de la rodilla. CAUSAS Esta afeccin puede ser causada por lo siguiente:  Golpe directo (trauma) en la rodilla.  Sobreextensin de la rodilla.  Torcer la rodilla. FACTORES DE RIESGO Es ms probable que esta afeccin se manifieste en las personas que:  Software engineer ciertos deportes como por ejemplo:  Deportes de contacto, como ftbol Public house manager, rugby y Administrator.  Los deportes que se practican en terrenos irregulares como el ftbol o las carreras cross country.  Los deportes que [address] cambios rpidos de posturas como el bsquet, la danza, la gimnasia y el esqu.  Tienen poca fuerza y flexibilidad.  Tienen sobrepeso.  Tienen flexibilidad excesiva en las articulaciones (laxitud  articular). SNTOMAS Los sntomas de esta afeccin incluyen lo siguiente:  Hinchazn.  Dolor al realizar movimientos.  Dolor a Merchandiser, retail zona lesionada.  Inestabilidad.  Un chasquido que se produce en el momento de la lesin.  Incapacidad para permanecer parado o usar la rodilla lesionada para soportar (sostener) el peso del cuerpo. DIAGNSTICO Por lo general, esta afeccin se diagnostica mediante un examen fsico. Adems, pueden hacerle estudios de diagnstico por imgenes, por ejemplo:  Una radiografa.  Una resonancia magntica (RM). TRATAMIENTO El tratamiento de esta afeccin puede incluir lo siguiente:  Aplicar hielo en la zona afectada.  Analgsicos.  Colocar un aparato ortopdico o una frula en la rodilla para Financial planner.  Fisioterapia para ayudar a Chief Operating Officer y Higher education careers adviser de la rodilla.  Ciruga para Engineer, manufacturing systems. Esto puede Facilities manager graves. INSTRUCCIONES PARA EL CUIDADO EN EL HOGAR Si tiene una frula o un dispositivo ortopdico:   selos como se lo haya indicado el mdico. Quteselos solamente como se lo haya indicado el mdico.  Afloje la frula o el dispositivo ortopdico si los dedos de los pies se le entumecen, siente hormigueos o se le enfran y se tornan de Research officer, trade union.  Siga las indicaciones del mdico sobre el cuidado de la frula o del dispositivo ortopdico.  Mantenga la frula o el dispositivo ortopdico limpios y secos. Control del dolor, la rigidez y la hinchazn   Si se lo indican, aplique hielo sobre  la zona lesionada.  Ponga el hielo en una bolsa plstica.  Coloque una toalla entre la piel y la bolsa de hielo.  Coloque el hielo durante 20minutos, 2 a 3veces por Futures traderda.  Mueva los dedos de los pies con frecuencia para evitar que se entumezcan y para reducir la hinchazn.  Cuando est sentado o acostado, eleve la zona de la lesin por encima del nivel del  corazn. Conducir   No conduzca ni opere maquinaria pesada mientras toma analgsicos recetados.  Pregntele al mdico cundo puede volver a conducir vehculos, si tiene una frula o un dispositivo ortopdico en la rodilla. Actividad   Reanude sus actividades normales como se lo haya indicado el mdico. Pregntele al mdico qu actividades son seguras para usted.  Haga ejercicio a diario como se lo hayan indicado el fisioterapeuta o el mdico. Instrucciones generales   CenterPoint Energyome los medicamentos de venta libre y los recetados solamente como se lo haya indicado el mdico.  No tome baos de inmersin, no nade ni use el jacuzzi hasta que el mdico lo autorice. Pregntele al mdico si puede ducharse. Delle Reiningal vez solo le permitan tomar baos de Weveresponja.  No apoye el peso del cuerpo Intelsobre la extremidad Dillard'slesionada hasta que lo autorice el mdico.  Concurra a todas las visitas de control como se lo haya indicado el mdico. Esto es importante. SOLICITE ATENCIN MDICA SI:  Los sntomas no mejoran.  Los sntomas empeoran.  Siente hormigueo o adormecimiento en la zona de la lesin. SOLICITE ATENCIN MDICA DE INMEDIATO SI:  Tiene adormecimiento u hormigueo intensos en la pierna o en el pie.  El pie se torna color azul, blanco o gris y lo siente fro. Esta informacin no tiene Theme park managercomo fin reemplazar el consejo del mdico. Asegrese de hacerle al mdico cualquier pregunta que tenga. Document Released: 03/29/2006 Document Revised: 10/04/2015 Document Reviewed: 08/14/2014 Elsevier Interactive Patient Education  2017 [employer]

## 2016-06-27 NOTE — Progress Notes (Signed)
Subjective:  Patient ID: Caroline Oneal, female    DOB: 07/13/1960  Age: 56 y.o. MRN: [id number]  CC: Hypothyroidism   HPI Caroline Oneal presents for   1. Hypothyroidism: she is compliant with synthroid 150 mcg q AM. Her energy and sleep are good. No swelling in legs. Knee pain. No palpitations or chest pain.   2. Left knee pain: with mild swelling. Pain level is 10/10. Started hurting 2-3 days ago. She reports that she slipped and extended her foot to the left 3 times. She heard a popping sound. No ice or oral NSAID since injury.   Social History  Substance Use Topics  . Smoking status: Never Smoker  . Smokeless tobacco: Not on file  . Alcohol use No    Outpatient Medications Prior to Visit  Medication Sig Dispense Refill  . cetirizine (ZYRTEC) 10 MG tablet Take 1 tablet (10 mg total) by mouth daily. 30 tablet 11  . Diclofenac Sodium 3 % GEL Place 1-2 g onto the skin 4 (four) times daily as needed. 100 g 3  . hydrochlorothiazide (HYDRODIURIL) 25 MG tablet Take 1 tablet (25 mg total) by mouth daily. 30 tablet 11  . levothyroxine (SYNTHROID, LEVOTHROID) 150 MCG tablet Take 1 tablet (150 mcg total) by mouth daily before breakfast. 30 tablet 3   No facility-administered medications prior to visit.     ROS Review of Systems  Constitutional: Negative for chills and fever.  Eyes: Negative for visual disturbance.  Respiratory: Negative for shortness of breath.   Cardiovascular: Negative for chest pain.  Gastrointestinal: Negative for abdominal pain and blood in stool.  Musculoskeletal: Positive for arthralgias. Negative for back pain and neck pain.  Skin: Negative for rash.  Allergic/Immunologic: Negative for immunocompromised state.  Hematological: Negative for adenopathy. Does not bruise/bleed easily.  Psychiatric/Behavioral: Negative for dysphoric mood and suicidal ideas.    Objective:  BP (!) 142/80 (BP Location: Right Arm, Patient Position: Sitting, Cuff Size: Small)    Pulse 65   Temp 98.1 F (36.7 C) (Oral)   Ht 5\' 1"  (1.549 m)   Wt 166 lb 6.4 oz (75.5 kg)   SpO2 98%   BMI 31.44 kg/m   BP/Weight 06/27/2016 03/03/2016 12/16/2015  Systolic BP 155 134 133  Diastolic BP 88 80 80  Wt. (Lbs) 166.4 170.6 168  BMI 31.44 32.23 31.76    Physical Exam  Constitutional: She is oriented to person, place, and time. She appears well-developed and well-nourished. No distress.  HENT:  Head: Normocephalic and atraumatic.  Neck: Normal range of motion. Neck supple. No muscular tenderness present. No thyromegaly present.  Cardiovascular: Normal rate, regular rhythm, normal heart sounds and intact distal pulses.   Pulmonary/Chest: Effort normal and breath sounds normal.  Abdominal: Soft. Bowel sounds are normal. She exhibits no distension and no mass. There is no tenderness. There is no rebound and no guarding.  Musculoskeletal: She exhibits no edema.       Left knee: She exhibits decreased range of motion, swelling, effusion and abnormal patellar mobility. She exhibits no ecchymosis, no deformity, no laceration, no erythema, normal alignment, no LCL laxity, no bony tenderness, normal meniscus and no MCL laxity. Tenderness found. Medial joint line, lateral joint line, MCL, LCL and patellar tendon tenderness noted.  Neurological: She is alert and oriented to person, place, and time.  Skin: Skin is warm and dry. No rash noted.  Psychiatric: She has a normal mood and affect.   Lab Results  Component Value Date  TSH 6.14 (H) 03/03/2016    Assessment & Plan:   Caroline HesselbachMaria was seen today for hypothyroidism.  Diagnoses and all orders for this visit:  Hypothyroidism, unspecified type -     TSH  Sprain of left knee, unspecified ligament, initial encounter -     naproxen (NAPROSYN) 500 MG tablet; Take 1 tablet (500 mg total) by mouth 2 (two) times daily with a meal. -     DG Knee AP/LAT W/Sunrise Left; Future -     Misc. Devices (QUICK-FIT CRUTCHES) MISC; 2 each by Does not  apply route daily. -     Misc. Devices (ADJUSTABLE ALUMINUM CANE 3/4") MISC; 1 each by Does not apply route daily.  Other orders -     Tdap vaccine greater than or equal to 7yo IM    No orders of the defined types were placed in this encounter.   Follow-up: Return in about 6 weeks (around 08/08/2016) for Left knee sprain .   Dessa PhiJosalyn Nini Cavan MD

## 2016-06-28 LAB — TSH: TSH: 1.43 mIU/L

## 2016-07-04 ENCOUNTER — Ambulatory Visit (HOSPITAL_COMMUNITY)
Admission: RE | Admit: 2016-07-04 | Discharge: 2016-07-04 | Disposition: A | Discharge status: Home / Self Care | Payer: Self-pay | Source: Ambulatory Visit | Attending: Family Medicine | Admitting: Family Medicine

## 2016-07-04 ENCOUNTER — Ambulatory Visit (HOSPITAL_COMMUNITY)
Admission: RE | Admit: 2016-07-04 | Discharge: 2016-07-04 | Disposition: A | Discharge status: Home / Self Care | Payer: Self-pay | Attending: Family Medicine | Admitting: Family Medicine

## 2016-07-04 DIAGNOSIS — M1712 Unilateral primary osteoarthritis, left knee: Secondary | ICD-10-CM | POA: Insufficient documentation

## 2016-07-04 DIAGNOSIS — X58XXXA Exposure to other specified factors, initial encounter: Secondary | ICD-10-CM | POA: Insufficient documentation

## 2016-07-04 DIAGNOSIS — S8392XA Sprain of unspecified site of left knee, initial encounter: Secondary | ICD-10-CM | POA: Insufficient documentation

## 2016-07-04 DIAGNOSIS — M25462 Effusion, left knee: Secondary | ICD-10-CM | POA: Insufficient documentation

## 2016-07-05 ENCOUNTER — Telehealth: Payer: Self-pay

## 2016-07-05 NOTE — Telephone Encounter (Signed)
Pt was called and informed of lab results. 

## 2016-07-19 ENCOUNTER — Telehealth: Payer: Self-pay

## 2016-07-19 NOTE — Telephone Encounter (Signed)
Pt was called and informed of lab results. DPR on file Ignacia Bayley(julio [phone])

## 2016-08-08 ENCOUNTER — Ambulatory Visit: Payer: Self-pay | Admitting: Family Medicine

## 2016-08-08 MED FILL — LEVOTHYROXINE 150 MCG TAB: 150 | 30 days supply | Qty: 30 | Fill #3

## 2016-08-08 MED FILL — HYDROCHLOROTHIAZIDE 25 MG T: 25 | 30 days supply | Qty: 30 | Fill #5

## 2016-08-18 ENCOUNTER — Ambulatory Visit: Payer: Self-pay | Attending: Family Medicine

## 2016-08-18 ENCOUNTER — Ambulatory Visit: Payer: Self-pay | Attending: Family Medicine | Admitting: Family Medicine

## 2016-08-18 ENCOUNTER — Encounter: Payer: Self-pay | Admitting: Family Medicine

## 2016-08-18 VITALS — BP 149/88 | HR 70 | Temp 97.6°F | Ht 61.0 in | Wt 162.2 lb

## 2016-08-18 DIAGNOSIS — E039 Hypothyroidism, unspecified: Secondary | ICD-10-CM | POA: Insufficient documentation

## 2016-08-18 DIAGNOSIS — W010XXA Fall on same level from slipping, tripping and stumbling without subsequent striking against object, initial encounter: Secondary | ICD-10-CM | POA: Insufficient documentation

## 2016-08-18 DIAGNOSIS — Z79899 Other long term (current) drug therapy: Secondary | ICD-10-CM | POA: Insufficient documentation

## 2016-08-18 DIAGNOSIS — I10 Essential (primary) hypertension: Secondary | ICD-10-CM | POA: Insufficient documentation

## 2016-08-18 DIAGNOSIS — S8392XA Sprain of unspecified site of left knee, initial encounter: Secondary | ICD-10-CM | POA: Insufficient documentation

## 2016-08-18 MED ORDER — NAPROXEN 500 MG PO TABS: 500.0000 mg | ORAL_TABLET | Freq: Two times a day (BID) | ORAL | 0 refills | Status: DC

## 2016-08-18 MED FILL — NAPROXEN 500 MG TABLET: 500 | 15 days supply | Qty: 30 | Fill #0

## 2016-08-18 NOTE — Progress Notes (Signed)
Subjective:  Patient ID: Caroline Oneal, female    DOB: Sep 24, 1960  Age: 56 y.o. MRN: [id number]  CC: Knee Injury   HPI Caroline Oneal presents for   1. Hypothyroidism: she is compliant with synthroid 150 mcg q AM. Her energy and sleep are good. No swelling in legs. Knee pain. No palpitations or chest pain.   2. Left knee pain: with mild swelling. Pain level is 7/10. Started hurting on 06/26/2016.  She reports that she slipped and extended her foot to the left 3 times. She heard a popping sound. No ice or oral NSAID since injury.  She has been compressing with a bandage. She is able to bear weight better than last visit. She did not get naproxen after the last visit.  3. HTN:  Compliant wit HCTZ. No HA, CP, SOB or swelling.  Social History  Substance Use Topics  . Smoking status: Never Smoker  . Smokeless tobacco: Never Used  . Alcohol use No    Outpatient Medications Prior to Visit  Medication Sig Dispense Refill  . hydrochlorothiazide (HYDRODIURIL) 25 MG tablet Take 1 tablet (25 mg total) by mouth daily. 30 tablet 11  . levothyroxine (SYNTHROID, LEVOTHROID) 150 MCG tablet Take 1 tablet (150 mcg total) by mouth daily before breakfast. 30 tablet 3  . Misc. Devices (ADJUSTABLE ALUMINUM CANE 3/4") MISC 1 each by Does not apply route daily. (Patient not taking: Reported on 08/18/2016) 1 each 0  . Misc. Devices (QUICK-FIT CRUTCHES) MISC 2 each by Does not apply route daily. (Patient not taking: Reported on 08/18/2016) 2 each 0  . naproxen (NAPROSYN) 500 MG tablet Take 1 tablet (500 mg total) by mouth 2 (two) times daily with a meal. (Patient not taking: Reported on 08/18/2016) 30 tablet 0   No facility-administered medications prior to visit.     ROS Review of Systems  Constitutional: Negative for chills and fever.  Eyes: Negative for visual disturbance.  Respiratory: Negative for shortness of breath.   Cardiovascular: Negative for chest pain.  Gastrointestinal: Negative for abdominal  pain and blood in stool.  Musculoskeletal: Positive for arthralgias. Negative for back pain and neck pain.  Skin: Negative for rash.  Allergic/Immunologic: Negative for immunocompromised state.  Hematological: Negative for adenopathy. Does not bruise/bleed easily.  Psychiatric/Behavioral: Negative for dysphoric mood and suicidal ideas.    Objective:  BP (!) 149/88 (BP Location: Left Arm, Patient Position: Sitting, Cuff Size: Small)   Pulse 70   Temp 97.6 F (36.4 C) (Oral)   Ht 5\' 1"  (1.549 m)   Wt 162 lb 3.2 oz (73.6 kg)   SpO2 97%   BMI 30.65 kg/m   BP/Weight 08/18/2016 06/27/2016 03/03/2016  Systolic BP 149 142 134  Diastolic BP 88 80 80  Wt. (Lbs) 162.2 166.4 170.6  BMI 30.65 31.44 32.23    Physical Exam  Constitutional: She is oriented to person, place, and time. She appears well-developed and well-nourished. No distress.  HENT:  Head: Normocephalic and atraumatic.  Neck: Normal range of motion. Neck supple. No muscular tenderness present. No thyromegaly present.  Cardiovascular: Normal rate, regular rhythm, normal heart sounds and intact distal pulses.   Pulmonary/Chest: Effort normal and breath sounds normal.  Abdominal: Soft. Bowel sounds are normal. She exhibits no distension and no mass. There is no tenderness. There is no rebound and no guarding.  Musculoskeletal: She exhibits no edema.       Left knee: She exhibits decreased range of motion, swelling, effusion and abnormal patellar mobility. She  exhibits no ecchymosis, no deformity, no laceration, no erythema, normal alignment, no LCL laxity, no bony tenderness, normal meniscus and no MCL laxity. Tenderness found. Medial joint line, lateral joint line, MCL, LCL and patellar tendon tenderness noted.       Legs: Neurological: She is alert and oriented to person, place, and time.  Skin: Skin is warm and dry. No rash noted.  Psychiatric: She has a normal mood and affect.   Lab Results  Component Value Date   TSH 1.43  06/27/2016    Assessment & Plan:   Ayah was seen today for knee injury.  Diagnoses and all orders for this visit:  Essential hypertension  Sprain of left knee, unspecified ligament, initial encounter -     naproxen (NAPROSYN) 500 MG tablet; Take 1 tablet (500 mg total) by mouth 2 (two) times daily with a meal. -     MR Knee Left  Wo Contrast; Future    No orders of the defined types were placed in this encounter.   Follow-up: Return in about 3 weeks (around 09/08/2016) for left knee pain .   Dessa Phi MD

## 2016-08-18 NOTE — Progress Notes (Signed)
Pt would like pain medication for her knee. Pt states she never picked up script from last visit.

## 2016-08-18 NOTE — Patient Instructions (Addendum)
Caroline HesselbachMaria was seen today for knee injury.  Diagnoses and all orders for this visit:  Essential hypertension  Sprain of left knee, unspecified ligament, initial encounter -     naproxen (NAPROSYN) 500 MG tablet; Take 1 tablet (500 mg total) by mouth 2 (two) times daily with a meal. -     MR Knee Left  Wo Contrast; Future   Complete MRI   F/u in 3 weeks for L knee pain   Dr. Armen PickupFunches

## 2016-08-18 NOTE — Assessment & Plan Note (Signed)
Elevated on last 2 visit Patient with significant pain Plan: Close monitoring Add CCB if elevation persist Treat pain

## 2016-08-18 NOTE — Assessment & Plan Note (Signed)
Left knee sprain with swelling and pain concerning for soft tissue injury Plan: Naproxen MRI L knee  Ortho referral if there is evidence of meniscal tear

## 2016-08-29 ENCOUNTER — Encounter (HOSPITAL_COMMUNITY): Payer: Self-pay

## 2016-08-30 ENCOUNTER — Ambulatory Visit (HOSPITAL_COMMUNITY): Admission: RE | Admit: 2016-08-30 | Payer: Self-pay | Source: Ambulatory Visit

## 2016-09-12 ENCOUNTER — Ambulatory Visit: Payer: Self-pay | Attending: Family Medicine | Admitting: Family Medicine

## 2016-09-12 ENCOUNTER — Encounter: Payer: Self-pay | Admitting: Family Medicine

## 2016-09-12 VITALS — BP 150/86 | HR 59 | Temp 98.2°F | Ht 61.0 in | Wt 170.2 lb

## 2016-09-12 DIAGNOSIS — W000XXA Fall on same level due to ice and snow, initial encounter: Secondary | ICD-10-CM | POA: Insufficient documentation

## 2016-09-12 DIAGNOSIS — Z79899 Other long term (current) drug therapy: Secondary | ICD-10-CM | POA: Insufficient documentation

## 2016-09-12 DIAGNOSIS — R5383 Other fatigue: Secondary | ICD-10-CM

## 2016-09-12 DIAGNOSIS — E039 Hypothyroidism, unspecified: Secondary | ICD-10-CM | POA: Insufficient documentation

## 2016-09-12 DIAGNOSIS — I10 Essential (primary) hypertension: Secondary | ICD-10-CM | POA: Insufficient documentation

## 2016-09-12 DIAGNOSIS — S8392XA Sprain of unspecified site of left knee, initial encounter: Secondary | ICD-10-CM | POA: Insufficient documentation

## 2016-09-12 LAB — CBC
HCT: 40.8 % (ref 35.0–45.0)
HEMOGLOBIN: 13.9 g/dL (ref 11.7–15.5)
MCH: 30.7 pg (ref 27.0–33.0)
MCHC: 34.1 g/dL (ref 32.0–36.0)
MCV: 90.1 fL (ref 80.0–100.0)
MPV: 9.4 fL (ref 7.5–12.5)
PLATELETS: 327 10*3/uL (ref 140–400)
RBC: 4.53 MIL/uL (ref 3.80–5.10)
RDW: 14.3 % (ref 11.0–15.0)
WBC: 7.9 10*3/uL (ref 3.8–10.8)

## 2016-09-12 LAB — TSH: TSH: 3.62 m[IU]/L

## 2016-09-12 MED ORDER — LEVOTHYROXINE SODIUM 150 MCG PO TABS: 150.0000 ug | ORAL_TABLET | Freq: Every day | ORAL | 3 refills | Status: DC

## 2016-09-12 MED ORDER — NAPROXEN 500 MG PO TABS: 500.0000 mg | ORAL_TABLET | Freq: Two times a day (BID) | ORAL | 0 refills | Status: DC | PRN

## 2016-09-12 MED ORDER — MULTIVITAMINS PO CAPS: 1.0000 | ORAL_CAPSULE | Freq: Every day | ORAL | 11 refills | Status: DC

## 2016-09-12 MED ORDER — HYDROCHLOROTHIAZIDE 25 MG PO TABS: 25.0000 mg | ORAL_TABLET | Freq: Every day | ORAL | 11 refills | Status: DC

## 2016-09-12 MED ORDER — LOSARTAN POTASSIUM 25 MG PO TABS: 25.0000 mg | ORAL_TABLET | Freq: Every day | ORAL | 2 refills | Status: DC

## 2016-09-12 MED FILL — HYDROCHLOROTHIAZIDE 25 MG T: 25 | 30 days supply | Qty: 30 | Fill #0

## 2016-09-12 MED FILL — ?NAPROXEN 500 MG TAB: 500 MG | 15 days supply | Qty: 30 | Fill #0

## 2016-09-12 MED FILL — ?LEVOTHYROXINE 150 MCG TAB: 150 | 30 days supply | Qty: 30 | Fill #0

## 2016-09-12 MED FILL — LOSARTAN POTASSIUM 25 MG TA: 25 | 30 days supply | Qty: 30 | Fill #0

## 2016-09-12 NOTE — Assessment & Plan Note (Addendum)
Elevated BP today Compliant with HCTZ, she took it at the beginning of the visit Continue HCTZ 25 mg daily Add losartan 25 mg daily Increase to 50 mg daily if  Needed for goal Bp < 140/90

## 2016-09-12 NOTE — Assessment & Plan Note (Signed)
Resolved Patient almost completely pain free

## 2016-09-12 NOTE — Patient Instructions (Addendum)
Caroline HesselbachMaria was seen today for knee pain.  Diagnoses and all orders for this visit:  Essential hypertension -     hydrochlorothiazide (HYDRODIURIL) 25 MG tablet; Take 1 tablet (25 mg total) by mouth daily. -     losartan (COZAAR) 25 MG tablet; Take 1 tablet (25 mg total) by mouth daily.  Sprain of left knee, unspecified ligament, initial encounter -     naproxen (NAPROSYN) 500 MG tablet; Take 1 tablet (500 mg total) by mouth 2 (two) times daily as needed for moderate pain. With food  Hypothyroidism, unspecified type -     Multiple Vitamin (MULTIVITAMIN) capsule; Take 1 capsule by mouth daily. -     TSH -     levothyroxine (SYNTHROID, LEVOTHROID) 150 MCG tablet; Take 1 tablet (150 mcg total) by mouth daily before breakfast.  Fatigue, unspecified type -     CBC   f/u in 2 weeks for BP check, increase losartan to 50 mg daily if needed at follow up f/u with me in 3 months  Dr. Armen PickupFunches

## 2016-09-12 NOTE — Progress Notes (Signed)
Pt is here today for follow up on left knee pain. Pt has no pain today.

## 2016-09-12 NOTE — Assessment & Plan Note (Signed)
A: hypothyroidism with fatigue reported compliance with synthroid P: Check TSH Multivitamin

## 2016-09-12 NOTE — Progress Notes (Signed)
Subjective:  Patient ID: Caroline Oneal, female    DOB: Mar 16, 1961  Age: [age] y.o. MRN: [id number]  CC: Knee Pain   HPI Caroline Oneal presents for   1. Hypothyroidism: she is compliant with synthroid 150 mcg q AM. She endorses fatigue.  No swelling in legs.. No palpitations or chest pain.   2. Left knee pain: pain and swelling have now almost completely resolved. Started hurting on 06/26/2016 following  Slip on ice and extended her foot to the left 3 times. She heard a popping sound. Naproxen has helped. She continues to take naproxen BID.   3. HTN:  Compliant wit HCTZ. No HA, CP, SOB or swelling. She is not exercising.   Social History  Substance Use Topics  . Smoking status: Never Smoker  . Smokeless tobacco: Never Used  . Alcohol use No    Outpatient Medications Prior to Visit  Medication Sig Dispense Refill  . hydrochlorothiazide (HYDRODIURIL) 25 MG tablet Take 1 tablet (25 mg total) by mouth daily. 30 tablet 11  . levothyroxine (SYNTHROID, LEVOTHROID) 150 MCG tablet Take 1 tablet (150 mcg total) by mouth daily before breakfast. 30 tablet 3  . naproxen (NAPROSYN) 500 MG tablet Take 1 tablet (500 mg total) by mouth 2 (two) times daily with a meal. 30 tablet 0   No facility-administered medications prior to visit.     ROS Review of Systems  Constitutional: Positive for fatigue. Negative for chills and fever.  Eyes: Negative for visual disturbance.  Respiratory: Negative for shortness of breath.   Cardiovascular: Negative for chest pain.  Gastrointestinal: Negative for abdominal pain and blood in stool.  Musculoskeletal: Positive for arthralgias. Negative for back pain and neck pain.  Skin: Negative for rash.  Allergic/Immunologic: Negative for immunocompromised state.  Hematological: Negative for adenopathy. Does not bruise/bleed easily.  Psychiatric/Behavioral: Negative for dysphoric mood and suicidal ideas.    Objective:  BP (!) 150/86   Pulse (!) 59   Temp 98.2 F  (36.8 C) (Oral)   Ht 5\' 1"  (1.549 m)   Wt 170 lb 3.2 oz (77.2 kg)   SpO2 97%   BMI 32.16 kg/m   BP/Weight 09/12/2016 08/18/2016 06/27/2016  Systolic BP 150 149 142  Diastolic BP 86 88 80  Wt. (Lbs) 170.2 162.2 166.4  BMI 32.16 30.65 31.44    Physical Exam  Constitutional: She is oriented to person, place, and time. She appears well-developed and well-nourished. No distress.  HENT:  Head: Normocephalic and atraumatic.  Neck: Normal range of motion. Neck supple. No muscular tenderness present. No thyromegaly present.  Cardiovascular: Normal rate, regular rhythm, normal heart sounds and intact distal pulses.   Pulmonary/Chest: Effort normal and breath sounds normal.  Abdominal: Soft. Bowel sounds are normal. She exhibits no distension and no mass. There is no tenderness. There is no rebound and no guarding.  Musculoskeletal: She exhibits no edema.       Left knee: Normal. She exhibits no ecchymosis, no deformity, no laceration, no erythema, normal alignment, no LCL laxity, no bony tenderness, normal meniscus and no MCL laxity.       Legs: Neurological: She is alert and oriented to person, place, and time.  Skin: Skin is warm and dry. No rash noted.  Psychiatric: She has a normal mood and affect.   Lab Results  Component Value Date   TSH 1.43 06/27/2016    Assessment & Plan:   Byrd HesselbachMaria was seen today for knee pain.  Diagnoses and all orders for this visit:  Essential hypertension -     hydrochlorothiazide (HYDRODIURIL) 25 MG tablet; Take 1 tablet (25 mg total) by mouth daily.  Sprain of left knee, unspecified ligament, initial encounter -     naproxen (NAPROSYN) 500 MG tablet; Take 1 tablet (500 mg total) by mouth 2 (two) times daily as needed for moderate pain. With food  Hypothyroidism, unspecified type -     Multiple Vitamin (MULTIVITAMIN) capsule; Take 1 capsule by mouth daily. -     TSH -     levothyroxine (SYNTHROID, LEVOTHROID) 150 MCG tablet; Take 1 tablet (150 mcg  total) by mouth daily before breakfast.    No orders of the defined types were placed in this encounter.   Follow-up: No Follow-up on file.   Dessa Phi MD

## 2016-09-14 ENCOUNTER — Telehealth: Payer: Self-pay

## 2016-09-14 NOTE — Telephone Encounter (Signed)
CMA call to go over lab results  Patient verify DOB  Patient was aware and understood   

## 2016-09-14 NOTE — Telephone Encounter (Signed)
-----   Message from Jaclyn ShaggyEnobong Amao, MD sent at 09/13/2016  5:08 PM EDT ----- Please inform the patient that labs are normal. Thank you.

## 2016-09-26 ENCOUNTER — Ambulatory Visit: Payer: Self-pay | Attending: Family Medicine | Admitting: *Deleted

## 2016-09-26 VITALS — BP 138/80 | HR 63 | Resp 16

## 2016-09-26 DIAGNOSIS — I10 Essential (primary) hypertension: Secondary | ICD-10-CM | POA: Insufficient documentation

## 2016-09-26 MED ORDER — LOSARTAN POTASSIUM 50 MG PO TABS: 50.0000 mg | ORAL_TABLET | Freq: Every day | ORAL | 3 refills | Status: DC

## 2016-09-26 NOTE — Progress Notes (Signed)
Pt arrived to [facility] for BP check.  Pt denies chest pain, SOB, HA, new vison concerns, or generalized swelling.   Has been taking medications as prescribed.  Blood pressure taken manually while patient is sitting.  Medication adjustments per Dr. Armen Pickup note 09/12/2016. Pt to increase losartan to 50 mg. Pt informed of new changes to BP regimen. Pt aware of f/u apt with Dr. Armen Pickup in June.  Pt verbalized understanding.  Nurse visit will be routed to provider.Guy Franco, RN, BSN

## 2016-09-26 NOTE — Patient Instructions (Addendum)
Blood pressure- 138/80 and 142/80  increase losartan to 50 mg daily   Follow in 3 months with Dr. Armen Pickup

## 2016-10-16 MED FILL — ?LEVOTHYROXINE 150 MCG TAB: 150 | 30 days supply | Qty: 30 | Fill #1

## 2016-10-16 MED FILL — LOSARTAN POTASSIUM 50 MG TA: 50 | 30 days supply | Qty: 30 | Fill #0

## 2016-11-08 ENCOUNTER — Encounter: Payer: Self-pay | Admitting: Family Medicine

## 2016-11-15 LAB — GLUCOSE, POCT (MANUAL RESULT ENTRY): POC Glucose: 122 mg/dL — AB (ref 70–99)

## 2016-11-21 MED FILL — LOSARTAN POTASSIUM 50 MG TA: 50 | 30 days supply | Qty: 30 | Fill #1

## 2016-11-21 MED FILL — ?LEVOTHYROXINE 150 MCG TAB: 150 | 30 days supply | Qty: 30 | Fill #2

## 2016-12-01 ENCOUNTER — Ambulatory Visit: Payer: Self-pay | Attending: Family Medicine

## 2016-12-14 ENCOUNTER — Encounter: Payer: Self-pay | Admitting: Family Medicine

## 2016-12-14 ENCOUNTER — Ambulatory Visit: Payer: Self-pay | Admitting: Family Medicine

## 2016-12-14 ENCOUNTER — Ambulatory Visit: Payer: Self-pay | Attending: Family Medicine | Admitting: Family Medicine

## 2016-12-14 VITALS — BP 159/82 | HR 63 | Temp 98.3°F | Wt 163.4 lb

## 2016-12-14 DIAGNOSIS — E039 Hypothyroidism, unspecified: Secondary | ICD-10-CM | POA: Insufficient documentation

## 2016-12-14 DIAGNOSIS — R059 Cough, unspecified: Secondary | ICD-10-CM | POA: Insufficient documentation

## 2016-12-14 DIAGNOSIS — R05 Cough: Secondary | ICD-10-CM | POA: Insufficient documentation

## 2016-12-14 DIAGNOSIS — R7309 Other abnormal glucose: Secondary | ICD-10-CM | POA: Insufficient documentation

## 2016-12-14 DIAGNOSIS — I10 Essential (primary) hypertension: Secondary | ICD-10-CM | POA: Insufficient documentation

## 2016-12-14 LAB — POCT GLYCOSYLATED HEMOGLOBIN (HGB A1C): Hemoglobin A1C: 6.1

## 2016-12-14 MED ORDER — LEVOTHYROXINE SODIUM 150 MCG PO TABS: 150.0000 ug | ORAL_TABLET | Freq: Every day | ORAL | 11 refills | Status: DC

## 2016-12-14 MED ORDER — LOSARTAN POTASSIUM 100 MG PO TABS: 100.0000 mg | ORAL_TABLET | Freq: Every day | ORAL | 5 refills | Status: DC

## 2016-12-14 MED FILL — LOSARTAN POTASSIUM 100 MG T: 100 | 30 days supply | Qty: 30 | Fill #0

## 2016-12-14 MED FILL — LEVOTHYROXINE 150 MCG TAB: 150 | 30 days supply | Qty: 30 | Fill #0

## 2016-12-14 NOTE — Assessment & Plan Note (Addendum)
A: elevated BP Med: patient reported CP with HCTZ which is unusual as she has been on HCTZ for several years. She is taking losartan 50 mg daily without side effect  P: Increase losartan to 100 mg daily If BP elevated at next visit add Norvasc 2.5 mg daily

## 2016-12-14 NOTE — Assessment & Plan Note (Addendum)
Repeat A1c done today is 6.1 Plan Low carb diet Increase exercise

## 2016-12-14 NOTE — Patient Instructions (Addendum)
Byrd HesselbachMaria was seen today for hypertension and hypothyroidism.  Diagnoses and all orders for this visit:  Hypothyroidism, unspecified type  Essential hypertension  Elevated hemoglobin A1c -     HgB A1c  Cough  Other orders -     losartan (COZAAR) 100 MG tablet; Take 1 tablet (100 mg total) by mouth daily.  You have a viral URI with cough. For this please do the following:  1. Drink plenty of fluids. Hot tea, soup etc will help open your nasal passages. 2. Dextromethorphan 30 mg every 6-8 hrs (plain Robitussin) for cough suppression 3. Tylenol for pain up to 1000 mg three times daily.  4. Nasal saline-OTC nose spray for congestion.  5. OTC antihistamine cetirizine, fexofenadine daily for congestion, sneezing and cough   F/u for chest pain, shortness of breath, persistent high fever.   F/u in 4 weeks for BP check with RN  F/u in 3 months with PCP for HTN and hypothyroidism   Dr. Armen PickupFunches

## 2016-12-14 NOTE — Assessment & Plan Note (Signed)
She has lost weight Lab Results  Component Value Date   TSH 3.62 09/12/2016   Continue current synthroid dose of 150 mcg Repeat TSH is 3 months

## 2016-12-14 NOTE — Assessment & Plan Note (Signed)
Cough and sneezing with mild nasal mucosa edema Suspect allergies vs mild URI Information on supportive care provided including nasal saline and prn oral antihistamine

## 2016-12-14 NOTE — Progress Notes (Signed)
Subjective:  Patient ID: Caroline Oneal, female    DOB: 07/22/1960  Age: 56 y.o. MRN: [id number]  CC: Hypertension and Hypothyroidism   HPI Caroline Oneal presents for   1. Hypothyroidism: she is compliant with synthroid 150 mcg q AM. She endorses fatigue.  No swelling in legs. No palpitations or chest pain.   2. Hypertension:  Compliant with losartan 50  mg daily. She took 2 doses of HCTZ 25 mg mg but stopped due to chest pain. She reports pain was in L side of chest. She reports the pain was strong. She denies associated cough or swelling. She denies rash. She reports a little chest pain since then right after taking the pill.  Today she reports no HA, SOB or swelling. She is not exercising.   3. Cough and cold: x 2 weeks. With intermittnet sneezing. No fever, chills, CP or SOB.     Social History  Substance Use Topics  . Smoking status: Never Smoker  . Smokeless tobacco: Never Used  . Alcohol use No    Outpatient Medications Prior to Visit  Medication Sig Dispense Refill  . hydrochlorothiazide (HYDRODIURIL) 25 MG tablet Take 1 tablet (25 mg total) by mouth daily. 30 tablet 11  . levothyroxine (SYNTHROID, LEVOTHROID) 150 MCG tablet Take 1 tablet (150 mcg total) by mouth daily before breakfast. 30 tablet 3  . losartan (COZAAR) 50 MG tablet Take 1 tablet (50 mg total) by mouth daily. 90 tablet 3  . Multiple Vitamin (MULTIVITAMIN) capsule Take 1 capsule by mouth daily. (Patient not taking: Reported on 09/26/2016) 30 capsule 11  . naproxen (NAPROSYN) 500 MG tablet Take 1 tablet (500 mg total) by mouth 2 (two) times daily as needed for moderate pain. With food 30 tablet 0   No facility-administered medications prior to visit.     ROS Review of Systems  Constitutional: Negative for chills, fatigue and fever.  HENT: Positive for sneezing.   Eyes: Negative for visual disturbance.  Respiratory: Positive for cough. Negative for shortness of breath.   Cardiovascular: Negative for  chest pain.  Gastrointestinal: Negative for abdominal pain and blood in stool.  Musculoskeletal: Positive for arthralgias. Negative for back pain and neck pain.  Skin: Negative for rash.  Allergic/Immunologic: Negative for immunocompromised state.  Hematological: Negative for adenopathy. Does not bruise/bleed easily.  Psychiatric/Behavioral: Negative for dysphoric mood and suicidal ideas.    Objective:  BP (!) 159/82   Pulse 63   Temp 98.3 F (36.8 C) (Oral)   Wt 163 lb 6.4 oz (74.1 kg)   SpO2 97%   BMI 30.87 kg/m   BP/Weight 12/14/2016 11/15/2016 09/26/2016  Systolic BP 159 155 138  Diastolic BP 82 71 80  Wt. (Lbs) 163.4 - -  BMI 30.87 - -   Wt Readings from Last 3 Encounters:  12/14/16 163 lb 6.4 oz (74.1 kg)  09/12/16 170 lb 3.2 oz (77.2 kg)  08/18/16 162 lb 3.2 oz (73.6 kg)    Physical Exam  Constitutional: She is oriented to person, place, and time. She appears well-developed and well-nourished. No distress.  HENT:  Head: Normocephalic and atraumatic.  Right Ear: Tympanic membrane, external ear and ear canal normal.  Left Ear: Tympanic membrane, external ear and ear canal normal.  Nose: Mucosal edema (L side ) present. Right sinus exhibits no maxillary sinus tenderness and no frontal sinus tenderness. Left sinus exhibits no maxillary sinus tenderness and no frontal sinus tenderness.  Neck: Normal range of motion. Neck supple. No muscular tenderness  present. No thyromegaly present.  Cardiovascular: Normal rate, regular rhythm, normal heart sounds and intact distal pulses.   Pulmonary/Chest: Effort normal and breath sounds normal.  Abdominal: Soft. Bowel sounds are normal. She exhibits no distension and no mass. There is no tenderness. There is no rebound and no guarding.  Musculoskeletal: She exhibits no edema.  Neurological: She is alert and oriented to person, place, and time.  Skin: Skin is warm and dry. No rash noted.  Psychiatric: She has a normal mood and affect.    Lab Results  Component Value Date   TSH 3.62 09/12/2016   Lab Results  Component Value Date   HGBA1C 6.0 07/19/2015    Assessment & Plan:  Caroline Oneal was seen today for hypertension and hypothyroidism.  Diagnoses and all orders for this visit:  Hypothyroidism, unspecified type -     levothyroxine (SYNTHROID, LEVOTHROID) 150 MCG tablet; Take 1 tablet (150 mcg total) by mouth daily before breakfast.  Essential hypertension  Elevated hemoglobin A1c -     HgB A1c  Cough  Other orders -     losartan (COZAAR) 100 MG tablet; Take 1 tablet (100 mg total) by mouth daily.   There are no diagnoses linked to this encounter.  No orders of the defined types were placed in this encounter.   Follow-up: Return in about 3 months (around 03/16/2017) for htn and hypothyroidism.   Dessa PhiJosalyn Reggie Bise MD

## 2017-01-08 MED FILL — metroNIDAZOLE 500 MG TABS: 500 | 10 days supply | Qty: 40 | Fill #0

## 2017-01-12 ENCOUNTER — Ambulatory Visit: Payer: Self-pay | Attending: Internal Medicine | Admitting: *Deleted

## 2017-01-12 VITALS — BP 155/89 | HR 64

## 2017-01-12 DIAGNOSIS — I10 Essential (primary) hypertension: Secondary | ICD-10-CM | POA: Insufficient documentation

## 2017-01-12 DIAGNOSIS — Z0130 Encounter for examination of blood pressure without abnormal findings: Secondary | ICD-10-CM

## 2017-01-12 MED ORDER — AMLODIPINE BESYLATE 2.5 MG PO TABS: 2.5000 mg | ORAL_TABLET | Freq: Every day | ORAL | 3 refills | Status: DC

## 2017-01-12 MED FILL — AMLODIPINE BESYLATE 2.5 MG: 2.5 | 30 days supply | Qty: 30 | Fill #0

## 2017-01-12 NOTE — Progress Notes (Signed)
[facility] alert and oriented and arrives in good spirits. Last OV 12/14/2016 with Dr. Armen PickupFunches.  Pt denies chest pain, SOB, HA, dizziness, or blurred vision.  Verified medication in pt chart.  Pt verbalizes medication was taken this morning.  Manual blood pressure reading: 160/80  155/89 on dinamap    Plan- Norvasc 2.5 mg daily Return 2 weeks Information provided with AVS Pt verbalized understanding

## 2017-01-12 NOTE — Patient Instructions (Signed)
Plan- Norvasc 2.5 mg daily  Return 2 weeks

## 2017-01-26 ENCOUNTER — Ambulatory Visit: Payer: Self-pay | Attending: Family Medicine | Admitting: *Deleted

## 2017-01-26 VITALS — BP 131/78 | HR 62

## 2017-01-26 DIAGNOSIS — I10 Essential (primary) hypertension: Secondary | ICD-10-CM

## 2017-01-26 DIAGNOSIS — Z0130 Encounter for examination of blood pressure without abnormal findings: Secondary | ICD-10-CM

## 2017-01-26 NOTE — Progress Notes (Signed)
[facility] Pt alert and oriented and arrives in good spirits. Last OV Dr. Armen PickupFunches.   Pt denies chest pain, SOB, HA, dizziness, or blurred vision.  Verified medication. Pt states medication was taken this morning and she is only taking one BP medication.  Blood pressure reading: 137/84 and 131/78      Pt states she is only taking one BP medication

## 2017-02-01 ENCOUNTER — Telehealth: Payer: Self-pay | Admitting: Family Medicine

## 2017-02-01 MED FILL — LOSARTAN POTASSIUM 100 MG T: 100 | 30 days supply | Qty: 30 | Fill #1

## 2017-02-01 NOTE — Telephone Encounter (Signed)
Pt. Came to facility stating that when she came to the facility on 01/26/17 the nurse had told her that she was going to send an Rx to the pharmacy for her BP. Please f/u with pt.

## 2017-02-12 MED FILL — LEVOTHYROXINE 150 MCG TAB: 150 | 30 days supply | Qty: 30 | Fill #1

## 2017-03-16 ENCOUNTER — Encounter: Payer: Self-pay | Admitting: Internal Medicine

## 2017-03-16 ENCOUNTER — Ambulatory Visit: Payer: Self-pay | Attending: Internal Medicine | Admitting: Internal Medicine

## 2017-03-16 VITALS — BP 155/81 | HR 62 | Temp 98.5°F | Resp 16 | Wt 166.0 lb

## 2017-03-16 DIAGNOSIS — Z1231 Encounter for screening mammogram for malignant neoplasm of breast: Secondary | ICD-10-CM

## 2017-03-16 DIAGNOSIS — Z1239 Encounter for other screening for malignant neoplasm of breast: Secondary | ICD-10-CM

## 2017-03-16 DIAGNOSIS — E119 Type 2 diabetes mellitus without complications: Secondary | ICD-10-CM | POA: Insufficient documentation

## 2017-03-16 DIAGNOSIS — R7303 Prediabetes: Secondary | ICD-10-CM

## 2017-03-16 DIAGNOSIS — I10 Essential (primary) hypertension: Secondary | ICD-10-CM

## 2017-03-16 DIAGNOSIS — E669 Obesity, unspecified: Secondary | ICD-10-CM | POA: Insufficient documentation

## 2017-03-16 DIAGNOSIS — J069 Acute upper respiratory infection, unspecified: Secondary | ICD-10-CM

## 2017-03-16 DIAGNOSIS — E039 Hypothyroidism, unspecified: Secondary | ICD-10-CM

## 2017-03-16 MED ORDER — AMLODIPINE BESYLATE 5 MG PO TABS: 5.0000 mg | ORAL_TABLET | Freq: Every day | ORAL | 3 refills | Status: DC

## 2017-03-16 MED FILL — AMLODIPINE BESYLATE 5 MG TA: 5 | 30 days supply | Qty: 30 | Fill #0

## 2017-03-16 NOTE — Progress Notes (Signed)
Patient ID: Caroline Oneal, female    DOB: 02-12-1961  MRN: [id number]  CC: re-establish and Hypertension   Subjective: Caroline Oneal is a 56 y.o. female who presents for chronic ds management Her concerns today include:  Pt with hx of hypothyroid, HTN, pre-DM (A1C 6.1 June)  1. C/o cold/chills and subjective fever x 2 wks -+ dry cough, nasal congestion, + sore throat -daughter sick with same symptoms but doing better Using Nyquil which helps.   2. HTN: compliant with Norvasc and Cozaar. Took meds just now -no device to check BP. No CP/SOB/LE edema -no getting in any exercise  3. Pre-DM -not getting in exercise -not eating a lot of sweets,   4. Hypothyroid: compliant with Levothyroxine -no major wgh swings. No palpitations Patient Active Problem List   Diagnosis Date Noted  . Cough 12/14/2016  . Poor dentition 08/04/2015  . Allergic rhinitis 01/13/2015  . Obesity 06/03/2014  . Elevated hemoglobin A1c 06/03/2014  . Hypothyroidism 06/03/2014  . HTN (hypertension) 10/13/2011     Current Outpatient Prescriptions on File Prior to Visit  Medication Sig Dispense Refill  . levothyroxine (SYNTHROID, LEVOTHROID) 150 MCG tablet Take 1 tablet (150 mcg total) by mouth daily before breakfast. 30 tablet 11  . losartan (COZAAR) 100 MG tablet Take 1 tablet (100 mg total) by mouth daily. 30 tablet 5  . Multiple Vitamin (MULTIVITAMIN) capsule Take 1 capsule by mouth daily. 30 capsule 11   No current facility-administered medications on file prior to visit.     Allergies  Allergen Reactions  . Hctz [Hydrochlorothiazide] Other (See Comments)    Chest pain     Social History   Social History  . Marital status: Single    Spouse name: N/A  . Number of children: N/A  . Years of education: N/A   Occupational History  . Not on file.   Social History Main Topics  . Smoking status: Never Smoker  . Smokeless tobacco: Never Used  . Alcohol use No  . Drug use: No  . Sexual  activity: Yes    Partners: Male    Birth control/ protection: Surgical     Comment: tubal   Other Topics Concern  . Not on file   Social History Narrative  . No narrative on file    No family history on file.  Past Surgical History:  Procedure Laterality Date  . TUBAL LIGATION      ROS: Review of Systems Negative except as stated above PHYSICAL EXAM: BP (!) 155/81   Pulse 62   Temp 98.5 F (36.9 C) (Oral)   Resp 16   Wt 166 lb (75.3 kg)   SpO2 97%   BMI 31.37 kg/m   Wt Readings from Last 3 Encounters:  03/16/17 166 lb (75.3 kg)  12/14/16 163 lb 6.4 oz (74.1 kg)  09/12/16 170 lb 3.2 oz (77.2 kg)    Physical Exam  General appearance - alert, well appearing, and in no distress Mental status - alert, oriented to person, place, and time, normal mood, behavior, speech, dress, motor activity, and thought processes Nose - mild enlargement of nasal turbinates LT>RT Mouth - mucous membranes moist, pharynx normal without lesions Neck - supple, no significant adenopathy Chest - few scattered wheezes, rales or rhonchi, symmetric air entry. Mild audible congestion Heart - normal rate, regular rhythm, normal S1, S2, no murmurs, rubs, clicks or gallops Extremities - peripheral pulses normal, no pedal edema, no clubbing or cyanosis   Lab Results  Component Value Date   WBC 7.9 09/12/2016   HGB 13.9 09/12/2016   HCT 40.8 09/12/2016   MCV 90.1 09/12/2016   PLT 327 09/12/2016     Chemistry      Component Value Date/Time   NA 142 03/03/2016 1206   K 3.6 03/03/2016 1206   CL 105 03/03/2016 1206   CO2 29 03/03/2016 1206   BUN 8 03/03/2016 1206   CREATININE 0.66 03/03/2016 1206      Component Value Date/Time   CALCIUM 9.7 03/03/2016 1206   ALKPHOS 79 06/03/2014 1043   AST 16 06/03/2014 1043   ALT 14 06/03/2014 1043   BILITOT 0.4 06/03/2014 1043      Results for orders placed or performed in visit on 12/14/16  HgB A1c  Result Value Ref Range   Hemoglobin A1C 6.1      ASSESSMENT AND PLAN: 1. Essential hypertension Not at goal Increase Norvasc to 5 mg - amLODipine (NORVASC) 5 MG tablet; Take 1 tablet (5 mg total) by mouth daily.  Dispense: 90 tablet; Refill: 3  2. Hypothyroidism, unspecified type -continue current dose Levothyroxine  3. Pre-diabetes -discussed important of healthy eating habits and regular aerobic exercise like brisk walk 3-4 x a wk  4. Viral upper respiratory tract infection -pt on tail end of this. Can use Coricidine HBP OTC as needed Will comeback to get free shot next wk  5. Breast cancer screening MMG ordered  Patient was given the opportunity to ask questions.  Patient verbalized understanding of the plan and was able to repeat key elements of the plan.   Orders Placed This Encounter  Procedures  . MM Digital Screening     Requested Prescriptions   Signed Prescriptions Disp Refills  . amLODipine (NORVASC) 5 MG tablet 90 tablet 3    Sig: Take 1 tablet (5 mg total) by mouth daily.    Return in about 3 months (around 06/15/2017).  Caroline Blue, MD, FACP

## 2017-03-16 NOTE — Patient Instructions (Addendum)
Please return to the flu clinic on Tuesday's between 2pm-5pm or Thursday's 9am- 12pm  Plan de alimentacin para la prediabetes (Prediabetes Eating Plan) La prediabetes, tambin llamada intolerancia a la glucosa o alteracin de la glucosa en ayunas, es una afeccin que eleva los niveles de azcar en la sangre (glucemia) por encima de lo normal. Seguir una dieta saludable puede ayudar a mantener la prediabetes bajo control, y tambin reduce el riesgo de tener diabetes tipo2 y cardiopata, que es ms alto en las personas que tienen esta afeccin. Junto con la actividad fsica habitual, una dieta saludable:  Promueve la prdida de [place].  Ayuda a Medical sales representative de Banker.  Ayuda a mejorar la forma en que el organismo [place] la insulina. QU DEBO SABER ACERCA DE ESTE PLAN DE ALIMENTACIN?  Use el ndice glucmico (IG) para planificar las comidas. El ndice le informa con qu rapidez un alimento elevar su nivel de azcar en la sangre. Elija los alimentos con bajo IG. Estos tardan ms tiempo en subir el nivel de azcar en la sangre.  Preste mucha atencin a la cantidad de hidratos de carbono que hay en los alimentos que consume. Los hidratos de carbono [employer] niveles de Banker.  Lleve un registro de la cantidad de caloras que ingiere. Ingerir la cantidad correcta de caloras lo ayudar a Cabin crew peso saludable. Bajar alrededor del 7por ciento del peso inicial puede ayudar a Automotive engineer la diabetes tipo2.  Tal vez deba seguir Clinical cytogeneticist. Esta incluye una gran cantidad de verduras, carnes magras o pescado, cereales integrales, frutas, as como aceites y grasas saludables.  QU ALIMENTOS PUEDO COMER? Cereales Cereales integrales, como panes, galletas, cereales y pastas de salvado o integrales. Avena sin azcar. Trigo burgol. Cebada. Quinua. Arroz integral. Tortillas o tacos de harina de maz o de salvado. [place] Brunette Deatra James. Espinaca. Guisantes.  Remolachas. Coliflor. Repollo. Brcoli. Zanahorias. Tomates. [place]. Christella Noa. Hierbas. Pimientos. Cebollas. Pepinos. Repollitos de Bruselas. Frutas Frutos rojos. Bananas. Manzanas. Naranjas. Uvas. Papaya. Mango. Gran[place]. Kiwi. Pomelo. Cerezas. Carnes y otras fuentes de protenas Mariscos. Carnes [place], entre ellas, pollo y Mosses o cortes magros de carne de cerdo y de [place]. Tofu. Huevos. Los frutos secos. Frijoles. Lcteos Productos lcteos descremados o semidescremados, como yogur, queso cottage y [place]. [employer] T. Caf. Gaseosas sin azcar o dietticas. Agua de [place]. Leche. Productos alternativos de la [place], [address] de soja o de [place]. Condimentos [place]. Salsa de pepinillos. [place] con bajo contenido de [place] y de International aid/development worker. Salsa barbacoa con bajo contenido de grasa y de azcar. Mayonesa sin grasa o con bajo contenido de May. Dulces y postres Budines sin azcar o con bajo contenido de [place]. Helados y otros dulces congelados sin azcar o con bajo contenido de [place]. Grasas y Arts development officer. Nueces. Aceite de oliva. Los artculos mencionados arriba pueden no ser [employer] de las bebidas o los alimentos recomendados. Comunquese con el nutricionista para conocer ms opciones. QU ALIMENTOS NO SE RECOMIENDAN? Cereales Productos a base de [place] y de [place], como panes, pastas, bocadillos y cereales. Bebidas Bebidas azucaradas, como t helado y gaseosas con International aid/development worker. Dulces y [address] de [place], [place] tortas, [place], [place], Programmer, systems y tarta de [place]. Los artculos mencionados arriba pueden no ser [employer] de las bebidas y los alimentos que se Theatre stage manager. Comunquese con el nutricionista para obtener ms informacin. Esta informacin no tiene Theme park manager el consejo del mdico. Asegrese de hacerle al  mdico cualquier pregunta que tenga. Document Released: 03/03/2015 Document Revised: 03/03/2015 Document  Reviewed: 07/08/2014 Elsevier Interactive Patient Education  2017 [employer].

## 2017-03-26 MED FILL — LOSARTAN POTASSIUM 100 MG T: 100 | 30 days supply | Qty: 30 | Fill #2

## 2017-03-26 MED FILL — LEVOTHYROXINE 150 MCG TAB: 150 | 30 days supply | Qty: 30 | Fill #2

## 2017-04-30 MED FILL — LEVOTHYROXINE 150 MCG TAB: 150 | 30 days supply | Qty: 30 | Fill #3

## 2017-04-30 MED FILL — LOSARTAN POTASSIUM 100 MG T: 100 | 30 days supply | Qty: 30 | Fill #3

## 2017-04-30 MED FILL — AMLODIPINE BESYLATE 5 MG TA: 5 | 30 days supply | Qty: 30 | Fill #1

## 2017-05-15 ENCOUNTER — Encounter: Payer: Self-pay | Admitting: Internal Medicine

## 2017-05-15 ENCOUNTER — Ambulatory Visit: Payer: Self-pay | Attending: Internal Medicine | Admitting: Internal Medicine

## 2017-05-15 VITALS — BP 161/73 | HR 53 | Temp 98.3°F | Resp 18 | Ht 61.0 in | Wt 166.0 lb

## 2017-05-15 DIAGNOSIS — E669 Obesity, unspecified: Secondary | ICD-10-CM | POA: Insufficient documentation

## 2017-05-15 DIAGNOSIS — I10 Essential (primary) hypertension: Secondary | ICD-10-CM | POA: Insufficient documentation

## 2017-05-15 DIAGNOSIS — E039 Hypothyroidism, unspecified: Secondary | ICD-10-CM | POA: Insufficient documentation

## 2017-05-15 DIAGNOSIS — Z79899 Other long term (current) drug therapy: Secondary | ICD-10-CM | POA: Insufficient documentation

## 2017-05-15 DIAGNOSIS — R7303 Prediabetes: Secondary | ICD-10-CM | POA: Insufficient documentation

## 2017-05-15 DIAGNOSIS — Z23 Encounter for immunization: Secondary | ICD-10-CM | POA: Insufficient documentation

## 2017-05-15 MED ORDER — LISINOPRIL 10 MG PO TABS: 10.0000 mg | ORAL_TABLET | Freq: Every day | ORAL | 3 refills | Status: DC

## 2017-05-15 MED FILL — LISINOPRIL 10 MG TABS: 10 | 30 days supply | Qty: 30 | Fill #0

## 2017-05-15 NOTE — Progress Notes (Signed)
Patient ID: Caroline Oneal, female    DOB: [birth year]  MRN: [id number]  CC: Medication Management   Subjective: Caroline Oneal is a 56 y.o. female who presents for UC.  Her concerns today include: Pt with hx of hypothyroid, HTN, pre-DM (A1C 6.1 June)  1. HTN: Patient complains of feeling chest pains and dizziness when she takes Cozaar and Norvasc.  She is not sure which one was causing the symptoms so she discontinued taking both 2 weeks ago.  - -Symptoms have resolved. She is not checking blood pressure. Patient Active Problem List   Diagnosis Date Noted  . Cough 12/14/2016  . Poor dentition 08/04/2015  . Allergic rhinitis 01/13/2015  . Obesity 06/03/2014  . Elevated hemoglobin A1c 06/03/2014  . Hypothyroidism 06/03/2014  . HTN (hypertension) 10/13/2011     Current Outpatient Medications on File Prior to Visit  Medication Sig Dispense Refill  . amLODipine (NORVASC) 5 MG tablet Take 1 tablet (5 mg total) by mouth daily. 90 tablet 3  . levothyroxine (SYNTHROID, LEVOTHROID) 150 MCG tablet Take 1 tablet (150 mcg total) by mouth daily before breakfast. 30 tablet 11  . losartan (COZAAR) 100 MG tablet Take 1 tablet (100 mg total) by mouth daily. 30 tablet 5  . Multiple Vitamin (MULTIVITAMIN) capsule Take 1 capsule by mouth daily. 30 capsule 11   No current facility-administered medications on file prior to visit.     Allergies  Allergen Reactions  . Hctz [Hydrochlorothiazide] Other (See Comments)    Chest pain     Social History   Socioeconomic History  . Marital status: Single    Spouse name: Not on file  . Number of children: Not on file  . Years of education: Not on file  . Highest education level: Not on file  Social Needs  . Financial resource strain: Not on file  . Food insecurity - worry: Not on file  . Food insecurity - inability: Not on file  . Transportation needs - medical: Not on file  . Transportation needs - non-medical: Not on file  Occupational  History  . Not on file  Tobacco Use  . Smoking status: Never Smoker  . Smokeless tobacco: Never Used  Substance and Sexual Activity  . Alcohol use: No  . Drug use: No  . Sexual activity: Yes    Partners: Male    Birth control/protection: Surgical    Comment: tubal  Other Topics Concern  . Not on file  Social History Narrative  . Not on file    History reviewed. No pertinent family history.  Past Surgical History:  Procedure Laterality Date  . TUBAL LIGATION      ROS: Review of Systems Negative except as stated above PHYSICAL EXAM: BP (!) 161/73 (BP Location: Left Arm, Patient Position: Sitting, Cuff Size: Large)   Pulse (!) 53   Temp 98.3 F (36.8 C) (Oral)   Resp 18   Ht 5\' 1"  (1.549 m)   Wt 166 lb (75.3 kg)   SpO2 99%   BMI 31.37 kg/m   Physical Exam General appearance - alert, well appearing, and in no distress Mental status - alert, oriented to person, place, and time, normal mood, behavior, speech, dress, motor activity, and thought processes Neck - supple, no significant adenopathy Chest - clear to auscultation, no wheezes, rales or rhonchi, symmetric air entry Heart - normal rate, regular rhythm, normal S1, S2, no murmurs, rubs, clicks or gallops  ASSESSMENT AND PLAN: 1. Essential hypertension Stop Norvasc and  Cozaar. Start lisinopril.  She has a follow-up appointment with me in 1 month we will recheck blood pressure at that time - lisinopril (PRINIVIL,ZESTRIL) 10 MG tablet; Take 1 tablet (10 mg total) by mouth daily.  Dispense: 90 tablet; Refill: 3  2. Need for influenza vaccination - Flu Vaccine QUAD 6+ mos PF IM (Fluarix Quad PF)  Patient was given the opportunity to ask questions.  Patient verbalized understanding of the plan and was able to repeat key elements of the plan.   No orders of the defined types were placed in this encounter.    Requested Prescriptions    No prescriptions requested or ordered in this encounter   F/u in 1  mth Jonah Blueeborah Johnson, MD, Jerrel IvoryFACP

## 2017-06-08 MED FILL — LISINOPRIL 10 MG TABS: 10 | 30 days supply | Qty: 30 | Fill #1

## 2017-06-15 ENCOUNTER — Ambulatory Visit: Payer: Self-pay | Admitting: Internal Medicine

## 2017-06-15 MED FILL — LEVOTHYROXINE 150 MCG TAB: 150 | 30 days supply | Qty: 30 | Fill #4

## 2017-06-15 MED FILL — AMLODIPINE BESYLATE 5 MG TA: 5 | 30 days supply | Qty: 30 | Fill #2
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
## 2017-07-10 ENCOUNTER — Ambulatory Visit: Payer: Self-pay | Attending: Internal Medicine

## 2017-07-19 ENCOUNTER — Ambulatory Visit: Payer: Self-pay | Attending: Internal Medicine | Admitting: Internal Medicine

## 2017-07-19 ENCOUNTER — Encounter: Payer: Self-pay | Admitting: Internal Medicine

## 2017-07-19 VITALS — BP 144/88 | HR 64 | Temp 98.3°F | Resp 16 | Ht 62.0 in | Wt 162.8 lb

## 2017-07-19 DIAGNOSIS — J309 Allergic rhinitis, unspecified: Secondary | ICD-10-CM

## 2017-07-19 DIAGNOSIS — I10 Essential (primary) hypertension: Secondary | ICD-10-CM

## 2017-07-19 DIAGNOSIS — H5789 Other specified disorders of eye and adnexa: Secondary | ICD-10-CM | POA: Insufficient documentation

## 2017-07-19 DIAGNOSIS — Z888 Allergy status to other drugs, medicaments and biological substances status: Secondary | ICD-10-CM | POA: Insufficient documentation

## 2017-07-19 DIAGNOSIS — Z1239 Encounter for other screening for malignant neoplasm of breast: Secondary | ICD-10-CM

## 2017-07-19 DIAGNOSIS — E039 Hypothyroidism, unspecified: Secondary | ICD-10-CM

## 2017-07-19 DIAGNOSIS — E669 Obesity, unspecified: Secondary | ICD-10-CM | POA: Insufficient documentation

## 2017-07-19 DIAGNOSIS — Z79899 Other long term (current) drug therapy: Secondary | ICD-10-CM | POA: Insufficient documentation

## 2017-07-19 DIAGNOSIS — Z9889 Other specified postprocedural states: Secondary | ICD-10-CM | POA: Insufficient documentation

## 2017-07-19 DIAGNOSIS — Z1231 Encounter for screening mammogram for malignant neoplasm of breast: Secondary | ICD-10-CM

## 2017-07-19 MED ORDER — LORATADINE 10 MG PO TABS: 10.0000 mg | ORAL_TABLET | Freq: Every day | ORAL | 11 refills | Status: DC

## 2017-07-19 MED FILL — LEVOTHYROXINE 150 MCG TAB: 150 | 30 days supply | Qty: 30 | Fill #5

## 2017-07-19 MED FILL — LISINOPRIL 10 MG TABS: 10 | 30 days supply | Qty: 30 | Fill #2

## 2017-07-19 NOTE — Patient Instructions (Signed)
Please call [phone] the [facility] (breast and cervical cancer control program) to schedule diagnostic mammogram

## 2017-07-19 NOTE — Progress Notes (Signed)
Patient ID: Caroline Oneal, female    DOB: [birth year]  MRN: [id number]  CC: Follow-up (3 month)   Subjective: Caroline Oneal is a 57 y.o. female who presents for f/u HTN.  Last seen 2 mths ago Her concerns today include:  Pt with hx of hypothyroid, HTN, pre-DM (A1C 6.1 June)  1.  HTN:  On last visit Norvasc and Cozaar d/c due to intolerance Started on Lisinopril on last visit  which she is tolerating  2.  C/o "allergies to cold weather" + nasal congestion, lacrimination and itchy eyes.  No itchy throat   3.  Compliant with Levothyroxine. No significant weight changes. Denies any heat or cold intolerance. Patient Active Problem List   Diagnosis Date Noted  . Cough 12/14/2016  . Poor dentition 08/04/2015  . Allergic rhinitis 01/13/2015  . Obesity 06/03/2014  . Elevated hemoglobin A1c 06/03/2014  . Hypothyroidism 06/03/2014  . HTN (hypertension) 10/13/2011     Current Outpatient Medications on File Prior to Visit  Medication Sig Dispense Refill  . levothyroxine (SYNTHROID, LEVOTHROID) 150 MCG tablet Take 1 tablet (150 mcg total) by mouth daily before breakfast. 30 tablet 11  . lisinopril (PRINIVIL,ZESTRIL) 10 MG tablet Take 1 tablet (10 mg total) by mouth daily. 90 tablet 3  . Multiple Vitamin (MULTIVITAMIN) capsule Take 1 capsule by mouth daily. (Patient not taking: Reported on 07/19/2017) 30 capsule 11   No current facility-administered medications on file prior to visit.     Allergies  Allergen Reactions  . Hctz [Hydrochlorothiazide] Other (See Comments)    Chest pain   . Norvasc [Amlodipine Besylate]     CP and dizziness    Social History   Socioeconomic History  . Marital status: Single    Spouse name: Not on file  . Number of children: Not on file  . Years of education: Not on file  . Highest education level: Not on file  Social Needs  . Financial resource strain: Not on file  . Food insecurity - worry: Not on file  . Food insecurity - inability: Not on  file  . Transportation needs - medical: Not on file  . Transportation needs - non-medical: Not on file  Occupational History  . Not on file  Tobacco Use  . Smoking status: Never Smoker  . Smokeless tobacco: Never Used  Substance and Sexual Activity  . Alcohol use: No  . Drug use: No  . Sexual activity: Yes    Partners: Male    Birth control/protection: Surgical    Comment: tubal  Other Topics Concern  . Not on file  Social History Narrative  . Not on file    No family history on file.  Past Surgical History:  Procedure Laterality Date  . TUBAL LIGATION      ROS: Review of Systems Negative except as above   PHYSICAL EXAM: BP (!) 152/83   Pulse 64   Temp 98.3 F (36.8 C) (Oral)   Resp 16   Ht 5\' 2"  (1.575 m)   Wt 162 lb 12.8 oz (73.8 kg)   SpO2 97%   BMI 29.78 kg/m   Wt Readings from Last 3 Encounters:  07/19/17 162 lb 12.8 oz (73.8 kg)  05/15/17 166 lb (75.3 kg)  03/16/17 166 lb (75.3 kg)  BP 144/80 Physical Exam  General appearance - alert, well appearing, and in no distress Mental status - alert, oriented to person, place, and time, normal mood, behavior, speech, dress, motor activity, and thought processes Eyes -  pink conjunctiva Nose - normal and patent, no erythema, discharge or polyps Mouth - mucous membranes moist, pharynx normal without lesions Chest - clear to auscultation, no wheezes, rales or rhonchi, symmetric air entry Heart - normal rate, regular rhythm, normal S1, S2, no murmurs, rubs, clicks or gallops  ASSESSMENT AND PLAN: 1. Essential hypertension Close to goal.  Continue current dose Lisinopril.   - CBC; Future - Comprehensive metabolic panel; Future - Lipid panel; Future  2. Allergic rhinitis, unspecified seasonality, unspecified trigger - loratadine (CLARITIN) 10 MG tablet; Take 1 tablet (10 mg total) by mouth daily.  Dispense: 30 tablet; Refill: 11  3. Hypothyroidism, unspecified type - TSH; Future  4. Breast cancer  screening - MM Digital Screening; Future  Patient was given the opportunity to ask questions.  Patient verbalized understanding of the plan and was able to repeat key elements of the plan.  [employer] interpreter used during this encounter.  Orders Placed This Encounter  Procedures  . MM Digital Screening  . CBC  . Comprehensive metabolic panel  . Lipid panel  . TSH     Requested Prescriptions   Signed Prescriptions Disp Refills  . loratadine (CLARITIN) 10 MG tablet 30 tablet 11    Sig: Take 1 tablet (10 mg total) by mouth daily.    Return in about 4 months (around 11/16/2017).  Jonah Blue, MD, FACP

## 2017-08-24 MED FILL — LISINOPRIL 10 MG TABS: 10 | 30 days supply | Qty: 30 | Fill #3

## 2017-08-28 MED FILL — LEVOTHYROXINE 150 MCG TAB: 150 | 30 days supply | Qty: 30 | Fill #6

## 2017-10-08 MED FILL — LEVOTHYROXINE 150 MCG TAB: 150 | 30 days supply | Qty: 30 | Fill #7

## 2017-10-08 MED FILL — LISINOPRIL 10 MG TABS: 10 | 30 days supply | Qty: 30 | Fill #4

## 2017-10-20 ENCOUNTER — Emergency Department (HOSPITAL_COMMUNITY)
Admission: EM | Admit: 2017-10-20 | Discharge: 2017-10-20 | Disposition: A | Discharge status: Home / Self Care | Payer: No Typology Code available for payment source | Attending: Emergency Medicine | Admitting: Emergency Medicine

## 2017-10-20 ENCOUNTER — Encounter (HOSPITAL_COMMUNITY): Payer: Self-pay

## 2017-10-20 ENCOUNTER — Other Ambulatory Visit: Payer: Self-pay

## 2017-10-20 ENCOUNTER — Emergency Department (HOSPITAL_COMMUNITY): Payer: No Typology Code available for payment source

## 2017-10-20 DIAGNOSIS — M545 Low back pain, unspecified: Secondary | ICD-10-CM

## 2017-10-20 DIAGNOSIS — M25572 Pain in left ankle and joints of left foot: Secondary | ICD-10-CM | POA: Insufficient documentation

## 2017-10-20 DIAGNOSIS — M542 Cervicalgia: Secondary | ICD-10-CM | POA: Diagnosis present

## 2017-10-20 DIAGNOSIS — M79645 Pain in left finger(s): Secondary | ICD-10-CM

## 2017-10-20 DIAGNOSIS — E039 Hypothyroidism, unspecified: Secondary | ICD-10-CM | POA: Diagnosis not present

## 2017-10-20 DIAGNOSIS — Z79899 Other long term (current) drug therapy: Secondary | ICD-10-CM | POA: Diagnosis not present

## 2017-10-20 DIAGNOSIS — I10 Essential (primary) hypertension: Secondary | ICD-10-CM | POA: Insufficient documentation

## 2017-10-20 MED ORDER — ACETAMINOPHEN 500 MG PO TABS
1000.0000 mg | ORAL_TABLET | Freq: Once | ORAL | Status: AC
  Administered 2017-10-20: 1000 mg via ORAL
  Filled 2017-10-20: qty 2

## 2017-10-20 MED ORDER — CYCLOBENZAPRINE HCL 10 MG PO TABS: 10.0000 mg | ORAL_TABLET | Freq: Two times a day (BID) | ORAL | 0 refills | Status: DC | PRN
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
## 2017-10-20 NOTE — Discharge Instructions (Signed)

## 2017-11-16 ENCOUNTER — Ambulatory Visit: Payer: Self-pay | Attending: Internal Medicine | Admitting: Internal Medicine

## 2017-11-16 ENCOUNTER — Encounter: Payer: Self-pay | Admitting: Internal Medicine

## 2017-11-16 VITALS — BP 134/90 | HR 65 | Temp 99.4°F | Resp 16 | Wt 166.4 lb

## 2017-11-16 DIAGNOSIS — E039 Hypothyroidism, unspecified: Secondary | ICD-10-CM

## 2017-11-16 DIAGNOSIS — I10 Essential (primary) hypertension: Secondary | ICD-10-CM

## 2017-11-16 DIAGNOSIS — Z79899 Other long term (current) drug therapy: Secondary | ICD-10-CM | POA: Insufficient documentation

## 2017-11-16 MED ORDER — LEVOTHYROXINE SODIUM 150 MCG PO TABS: 150.0000 ug | ORAL_TABLET | Freq: Every day | ORAL | 11 refills | Status: DC

## 2017-11-16 NOTE — Patient Instructions (Signed)
Please call the [facility] (breast and cervical cancer control program) at [phone] to schedule diagnostic mammogram  

## 2017-11-16 NOTE — Progress Notes (Signed)
Patient ID: Caroline Oneal, female    DOB: 01/27/1961  MRN: [id number]  CC: Hypertension   Subjective: Caroline Oneal is a 57 y.o. female who presents for chronic ds management Her concerns today include:  Pt with hx of hypothyroid, HTN, pre-DM (A1C 6.1 June)  HTN:  Consistent with Lisinopril and salt restriction.  Walking 1/2 hr daily No CP/SOB/LE edema  Thyroid:  Compliant with med No palpitations.  Thinks wgh on last visit was error.  Patient Active Problem List   Diagnosis Date Noted  . Cough 12/14/2016  . Poor dentition 08/04/2015  . Allergic rhinitis 01/13/2015  . Obesity 06/03/2014  . Elevated hemoglobin A1c 06/03/2014  . Hypothyroidism 06/03/2014  . HTN (hypertension) 10/13/2011     Current Outpatient Medications on File Prior to Visit  Medication Sig Dispense Refill  . cyclobenzaprine (FLEXERIL) 10 MG tablet Take 1 tablet (10 mg total) by mouth 2 (two) times daily as needed for muscle spasms. 10 tablet 0  . lisinopril (PRINIVIL,ZESTRIL) 10 MG tablet Take 1 tablet (10 mg total) by mouth daily. 90 tablet 3  . Multiple Vitamin (MULTIVITAMIN WITH MINERALS) TABS tablet Take 1 tablet by mouth daily.     No current facility-administered medications on file prior to visit.     Allergies  Allergen Reactions  . Hctz [Hydrochlorothiazide] Other (See Comments)    Chest pain   . Norvasc [Amlodipine Besylate]     CP and dizziness    Social History   Socioeconomic History  . Marital status: Single    Spouse name: Not on file  . Number of children: Not on file  . Years of education: Not on file  . Highest education level: Not on file  Occupational History  . Not on file  Social Needs  . Financial resource strain: Not on file  . Food insecurity:    Worry: Not on file    Inability: Not on file  . Transportation needs:    Medical: Not on file    Non-medical: Not on file  Tobacco Use  . Smoking status: Never Smoker  . Smokeless tobacco: Never Used  Substance  and Sexual Activity  . Alcohol use: No  . Drug use: No  . Sexual activity: Yes    Partners: Male    Birth control/protection: Surgical    Comment: tubal  Lifestyle  . Physical activity:    Days per week: Not on file    Minutes per session: Not on file  . Stress: Not on file  Relationships  . Social connections:    Talks on phone: Not on file    Gets together: Not on file    Attends religious service: Not on file    Active member of club or organization: Not on file    Attends meetings of clubs or organizations: Not on file    Relationship status: Not on file  . Intimate partner violence:    Fear of current or ex partner: Not on file    Emotionally abused: Not on file    Physically abused: Not on file    Forced sexual activity: Not on file  Other Topics Concern  . Not on file  Social History Narrative  . Not on file    No family history on file.  Past Surgical History:  Procedure Laterality Date  . TUBAL LIGATION      ROS: Review of Systems Neg except as above PHYSICAL EXAM: BP 134/90   Pulse 65   Temp  99.4 F (37.4 C) (Oral)   Resp 16   Wt 166 lb 6.4 oz (75.5 kg)   SpO2 96%   BMI 32.50 kg/m   Wt Readings from Last 3 Encounters:  11/16/17 166 lb 6.4 oz (75.5 kg)  10/20/17 140 lb (63.5 kg)  07/19/17 162 lb 12.8 oz (73.8 kg)   Repeat BP 133/83  Physical Exam  General appearance - alert, well appearing, and in no distress Mental status - normal mood, behavior, speech, dress, motor activity, and thought processes Neck - supple, no significant adenopathy Chest - clear to auscultation, no wheezes, rales or rhonchi, symmetric air entry Heart - normal rate, regular rhythm, normal S1, S2, no murmurs, rubs, clicks or gallops Extremities - peripheral pulses normal, no pedal edema, no clubbing or cyanosis  ASSESSMENT AND PLAN: 1. Hypothyroidism, unspecified type - levothyroxine (SYNTHROID, LEVOTHROID) 150 MCG tablet; Take 1 tablet (150 mcg total) by mouth daily  before breakfast.  Dispense: 30 tablet; Refill: 11 - TSH  2. Essential hypertension Close to goal.  Continue DASH diet and regular exercise.  Continue Lisinopril - Lipid panel - Comprehensive metabolic panel - CBC   Patient was given the opportunity to ask questions.  Patient verbalized understanding of the plan and was able to repeat key elements of the plan.   No orders of the defined types were placed in this encounter.    Requested Prescriptions   Signed Prescriptions Disp Refills  . levothyroxine (SYNTHROID, LEVOTHROID) 150 MCG tablet 30 tablet 11    Sig: Take 1 tablet (150 mcg total) by mouth daily before breakfast.    Return in about 4 months (around 03/19/2018).  Jonah Blue, MD, FACP

## 2017-11-17 LAB — CBC
Hematocrit: 43.2 % (ref 34.0–46.6)
Hemoglobin: 14.1 g/dL (ref 11.1–15.9)
MCH: 30.1 pg (ref 26.6–33.0)
MCHC: 32.6 g/dL (ref 31.5–35.7)
MCV: 92 fL (ref 79–97)
PLATELETS: 297 10*3/uL (ref 150–450)
RBC: 4.69 x10E6/uL (ref 3.77–5.28)
RDW: 14.8 % (ref 12.3–15.4)
WBC: 6.7 10*3/uL (ref 3.4–10.8)

## 2017-11-17 LAB — COMPREHENSIVE METABOLIC PANEL
ALBUMIN: 4.4 g/dL (ref 3.5–5.5)
ALK PHOS: 124 IU/L — AB (ref 39–117)
ALT: 25 IU/L (ref 0–32)
AST: 27 IU/L (ref 0–40)
Albumin/Globulin Ratio: 1.5 (ref 1.2–2.2)
BUN / CREAT RATIO: 22 (ref 9–23)
BUN: 15 mg/dL (ref 6–24)
Bilirubin Total: 0.4 mg/dL (ref 0.0–1.2)
CALCIUM: 9.4 mg/dL (ref 8.7–10.2)
CHLORIDE: 104 mmol/L (ref 96–106)
CO2: 24 mmol/L (ref 20–29)
Creatinine, Ser: 0.68 mg/dL (ref 0.57–1.00)
GFR calc Af Amer: 112 mL/min/{1.73_m2} (ref >59)
GFR calc non Af Amer: 97 mL/min/{1.73_m2} (ref >59)
GLUCOSE: 94 mg/dL (ref 65–99)
Globulin, Total: 2.9 g/dL (ref 1.5–4.5)
Potassium: 4.2 mmol/L (ref 3.5–5.2)
SODIUM: 143 mmol/L (ref 134–144)
Total Protein: 7.3 g/dL (ref 6.0–8.5)

## 2017-11-17 LAB — LIPID PANEL
Chol/HDL Ratio: 4.8 ratio — ABNORMAL HIGH (ref 0.0–4.4)
Cholesterol, Total: 220 mg/dL — ABNORMAL HIGH (ref 100–199)
HDL: 46 mg/dL (ref >39)
LDL Calculated: 120 mg/dL — ABNORMAL HIGH (ref 0–99)
Triglycerides: 269 mg/dL — ABNORMAL HIGH (ref 0–149)
VLDL CHOLESTEROL CAL: 54 mg/dL — AB (ref 5–40)

## 2017-11-17 LAB — TSH: TSH: 6.49 u[IU]/mL — AB (ref 0.450–4.500)

## 2017-11-18 ENCOUNTER — Encounter: Payer: Self-pay | Admitting: Internal Medicine

## 2017-11-18 ENCOUNTER — Other Ambulatory Visit: Payer: Self-pay | Admitting: Internal Medicine

## 2017-11-18 DIAGNOSIS — E785 Hyperlipidemia, unspecified: Secondary | ICD-10-CM | POA: Insufficient documentation

## 2017-11-18 MED ORDER — ATORVASTATIN CALCIUM 10 MG PO TABS: 10.0000 mg | ORAL_TABLET | Freq: Every day | ORAL | 3 refills | Status: DC

## 2017-11-20 MED FILL — ATORVASTATIN 10 MG TABLET: 10 | 90 days supply | Qty: 90 | Fill #0

## 2017-11-21 ENCOUNTER — Telehealth: Payer: Self-pay

## 2017-11-21 NOTE — Telephone Encounter (Signed)
-----   Message from Particia Lather, [place] sent at 11/20/2017  3:40 PM EDT -----   ----- Message ----- From: Marcine Matar, MD Sent: 11/18/2017  11:32 AM To: Particia Lather, RMA  Let pt know that her kidney and LFTs are normal.  Thyroid level mildly elevated.  Be sure to take the thyroid medication every day.  Cholesterol elevated at 120 with goal being less than 100.  I recommend starting a cholesterol medication called Lipitor to help lower cholesterol.  Rxn sent to pharmacy.  (For my info - ASCVD risk score of 11.4%)

## 2017-11-21 NOTE — Telephone Encounter (Signed)
CMA call regarding lab results   Patient Verify DOB   Patient was aware and understood  

## 2017-11-22 MED FILL — LEVOTHYROXINE 150 MCG TAB: 150 | 30 days supply | Qty: 30 | Fill #8

## 2017-11-22 MED FILL — LISINOPRIL 10 MG TABS: 10 | 30 days supply | Qty: 30 | Fill #5

## 2017-12-28 MED FILL — LEVOTHYROXINE 150 MCG TAB: 150 | 30 days supply | Qty: 30 | Fill #0

## 2017-12-28 MED FILL — LISINOPRIL 10 MG TABS: 10 | 30 days supply | Qty: 30 | Fill #6

## 2018-01-18 ENCOUNTER — Ambulatory Visit: Payer: Self-pay | Attending: Internal Medicine

## 2018-02-01 MED FILL — LEVOTHYROXINE 150 MCG TAB: 150 | 30 days supply | Qty: 30 | Fill #1

## 2018-02-01 MED FILL — LISINOPRIL 10 MG TABS: 10 | 30 days supply | Qty: 30 | Fill #7

## 2018-03-18 ENCOUNTER — Ambulatory Visit: Payer: Self-pay | Admitting: Internal Medicine

## 2018-03-21 MED FILL — LISINOPRIL 10 MG TABS: 10 | 30 days supply | Qty: 30 | Fill #8

## 2018-03-21 MED FILL — LEVOTHYROXINE 150 MCG TAB: 150 | 30 days supply | Qty: 30 | Fill #2

## 2018-03-29 ENCOUNTER — Ambulatory Visit: Payer: Self-pay | Attending: Internal Medicine | Admitting: Internal Medicine

## 2018-03-29 ENCOUNTER — Encounter: Payer: Self-pay | Admitting: Internal Medicine

## 2018-03-29 VITALS — BP 145/81 | HR 62 | Temp 98.6°F | Resp 16 | Ht 62.0 in | Wt 174.4 lb

## 2018-03-29 DIAGNOSIS — I10 Essential (primary) hypertension: Secondary | ICD-10-CM

## 2018-03-29 DIAGNOSIS — Z23 Encounter for immunization: Secondary | ICD-10-CM

## 2018-03-29 DIAGNOSIS — Z6831 Body mass index (BMI) 31.0-31.9, adult: Secondary | ICD-10-CM | POA: Insufficient documentation

## 2018-03-29 DIAGNOSIS — E669 Obesity, unspecified: Secondary | ICD-10-CM

## 2018-03-29 DIAGNOSIS — Z79899 Other long term (current) drug therapy: Secondary | ICD-10-CM | POA: Insufficient documentation

## 2018-03-29 DIAGNOSIS — E785 Hyperlipidemia, unspecified: Secondary | ICD-10-CM

## 2018-03-29 DIAGNOSIS — E039 Hypothyroidism, unspecified: Secondary | ICD-10-CM

## 2018-03-29 DIAGNOSIS — Z09 Encounter for follow-up examination after completed treatment for conditions other than malignant neoplasm: Secondary | ICD-10-CM | POA: Insufficient documentation

## 2018-03-29 DIAGNOSIS — Z79890 Hormone replacement therapy (postmenopausal): Secondary | ICD-10-CM | POA: Insufficient documentation

## 2018-03-29 DIAGNOSIS — E119 Type 2 diabetes mellitus without complications: Secondary | ICD-10-CM | POA: Insufficient documentation

## 2018-03-29 MED ORDER — LISINOPRIL 20 MG PO TABS: 20.0000 mg | ORAL_TABLET | Freq: Every day | ORAL | 3 refills | Status: DC

## 2018-03-29 NOTE — Progress Notes (Signed)
Patient is here for a four months follow-up on hypertension and thyroid follow-up.

## 2018-03-29 NOTE — Progress Notes (Signed)
Patient ID: Caroline Oneal, female    DOB: [birth year]  MRN: [id number]  CC: Follow-up   Subjective: Caroline Oneal is a 57 y.o. female who presents for chronic ds management.  Last seen 10/2017. Her concerns today include:  Pt with hx of hypothyroid, HTN, pre-DM (A1C 6.24 November 2016)  HL:  Lipid profile on last visit was not at goal.  I recommended starting Lipitor.  She is taking it and tolerating okay. Of note she was pre-DM prior to starting Lipitor  Hypothyroid:  Compliant with Levothyroxine.  TSH was mildly elevated on last visit. No heat or cold intolerance.  She has gained 8 lbs since last visit.  Reports she does not drink sweet drinks.  Does not eat much bread or rice.  Eats 4 tortias a day. Not getting in much exercise.   HTN:  Reports compliance with Lisinopril and took it already for today.  No device to check BP    Patient Active Problem List   Diagnosis Date Noted  . Hyperlipidemia 11/18/2017  . Cough 12/14/2016  . Poor dentition 08/04/2015  . Allergic rhinitis 01/13/2015  . Obesity 06/03/2014  . Prediabetes 06/03/2014  . Hypothyroidism 06/03/2014  . HTN (hypertension) 10/13/2011     Current Outpatient Medications on File Prior to Visit  Medication Sig Dispense Refill  . atorvastatin (LIPITOR) 10 MG tablet Take 1 tablet (10 mg total) by mouth daily. 90 tablet 3  . levothyroxine (SYNTHROID, LEVOTHROID) 150 MCG tablet Take 1 tablet (150 mcg total) by mouth daily before breakfast. 30 tablet 11  . lisinopril (PRINIVIL,ZESTRIL) 10 MG tablet Take 1 tablet (10 mg total) by mouth daily. 90 tablet 3  . cyclobenzaprine (FLEXERIL) 10 MG tablet Take 1 tablet (10 mg total) by mouth 2 (two) times daily as needed for muscle spasms. (Patient not taking: Reported on 03/29/2018) 10 tablet 0  . Multiple Vitamin (MULTIVITAMIN WITH MINERALS) TABS tablet Take 1 tablet by mouth daily.     No current facility-administered medications on file prior to visit.     Allergies  Allergen  Reactions  . Hctz [Hydrochlorothiazide] Other (See Comments)    Chest pain   . Norvasc [Amlodipine Besylate]     CP and dizziness    Social History   Socioeconomic History  . Marital status: Single    Spouse name: Not on file  . Number of children: Not on file  . Years of education: Not on file  . Highest education level: Not on file  Occupational History  . Not on file  Social Needs  . Financial resource strain: Not on file  . Food insecurity:    Worry: Not on file    Inability: Not on file  . Transportation needs:    Medical: Not on file    Non-medical: Not on file  Tobacco Use  . Smoking status: Never Smoker  . Smokeless tobacco: Never Used  Substance and Sexual Activity  . Alcohol use: No  . Drug use: No  . Sexual activity: Yes    Partners: Male    Birth control/protection: Surgical    Comment: tubal  Lifestyle  . Physical activity:    Days per week: Not on file    Minutes per session: Not on file  . Stress: Not on file  Relationships  . Social connections:    Talks on phone: Not on file    Gets together: Not on file    Attends religious service: Not on file    Active  member of club or organization: Not on file    Attends meetings of clubs or organizations: Not on file    Relationship status: Not on file  . Intimate partner violence:    Fear of current or ex partner: Not on file    Emotionally abused: Not on file    Physically abused: Not on file    Forced sexual activity: Not on file  Other Topics Concern  . Not on file  Social History Narrative  . Not on file    History reviewed. No pertinent family history.  Past Surgical History:  Procedure Laterality Date  . TUBAL LIGATION      ROS: Review of Systems Neg except as above. PHYSICAL EXAM: BP (!) 145/81 (BP Location: Right Arm, Patient Position: Sitting, Cuff Size: Normal)   Pulse 62   Temp 98.6 F (37 C) (Oral)   Resp 16   Ht 5\' 2"  (1.575 m)   Wt 174 lb 6.4 oz (79.1 kg)   SpO2 94%    BMI 31.90 kg/m   Wt Readings from Last 3 Encounters:  03/29/18 174 lb 6.4 oz (79.1 kg)  11/16/17 166 lb 6.4 oz (75.5 kg)  10/20/17 140 lb (63.5 kg)   Repeat BP 148/78 Physical Exam  General appearance - alert, well appearing, and in no distress Mental status - normal mood, behavior, speech, dress, motor activity, and thought processes Neck - supple, no significant adenopathy Chest - clear to auscultation, no wheezes, rales or rhonchi, symmetric air entry Heart - normal rate, regular rhythm, normal S1, S2, no murmurs, rubs, clicks or gallops Extremities - peripheral pulses normal, no pedal edema, no clubbing or cyanosis  Results for orders placed or performed in visit on 11/16/17  TSH  Result Value Ref Range   TSH 6.490 (H) 0.450 - 4.500 uIU/mL  Lipid panel  Result Value Ref Range   Cholesterol, Total 220 (H) 100 - 199 mg/dL   Triglycerides 347 (H) 0 - 149 mg/dL   HDL 46 >42 mg/dL   VLDL Cholesterol Cal 54 (H) 5 - 40 mg/dL   LDL Calculated 595 (H) 0 - 99 mg/dL   Chol/HDL Ratio 4.8 (H) 0.0 - 4.4 ratio  Comprehensive metabolic panel  Result Value Ref Range   Glucose 94 65 - 99 mg/dL   BUN 15 6 - 24 mg/dL   Creatinine, Ser 6.38 0.57 - 1.00 mg/dL   GFR calc non Af Amer 97 >59 mL/min/1.73   GFR calc Af Amer 112 >59 mL/min/1.73   BUN/Creatinine Ratio 22 9 - 23   Sodium 143 134 - 144 mmol/L   Potassium 4.2 3.5 - 5.2 mmol/L   Chloride 104 96 - 106 mmol/L   CO2 24 20 - 29 mmol/L   Calcium 9.4 8.7 - 10.2 mg/dL   Total Protein 7.3 6.0 - 8.5 g/dL   Albumin 4.4 3.5 - 5.5 g/dL   Globulin, Total 2.9 1.5 - 4.5 g/dL   Albumin/Globulin Ratio 1.5 1.2 - 2.2   Bilirubin Total 0.4 0.0 - 1.2 mg/dL   Alkaline Phosphatase 124 (H) 39 - 117 IU/L   AST 27 0 - 40 IU/L   ALT 25 0 - 32 IU/L  CBC  Result Value Ref Range   WBC 6.7 3.4 - 10.8 x10E3/uL   RBC 4.69 3.77 - 5.28 x10E6/uL   Hemoglobin 14.1 11.1 - 15.9 g/dL   Hematocrit 75.6 43.3 - 46.6 %   MCV 92 79 - 97 fL   MCH 30.1 26.6 -  33.0 pg     MCHC 32.6 31.5 - 35.7 g/dL   RDW 16.1 09.6 - 04.5 %   Platelets 297 150 - 450 x10E3/uL    ASSESSMENT AND PLAN: 1. Essential hypertension Not at goal. Increase Lisinopril to 20 mg daily - lisinopril (PRINIVIL,ZESTRIL) 20 MG tablet; Take 1 tablet (20 mg total) by mouth daily.  Dispense: 90 tablet; Refill: 3  2. Hyperlipidemia, unspecified hyperlipidemia type Patient is tolerating Lipitor.  Continue Lipitor  3. Hypothyroidism, unspecified type Pt declines repeat TSH today  4. Obesity (BMI 30-39.9) Discussed healthy eating habits. Encourage patient to get in some form of moderate aerobic exercise like brisk walking at least 3 days a week for 30 minutes.  5. Need for influenza vaccination Given.  F/u in 6 wks for PAP.  At that time, we can recheck TSH  Patient was given the opportunity to ask questions.  Patient verbalized understanding of the plan and was able to repeat key elements of the plan.  [employer] interpreter used during this encounter. #[id number]  No orders of the defined types were placed in this encounter.    Requested Prescriptions    No prescriptions requested or ordered in this encounter    No follow-ups on file.  Jonah Blue, MD, FACP

## 2018-05-09 ENCOUNTER — Ambulatory Visit: Payer: Self-pay | Admitting: Internal Medicine

## 2018-05-15 MED FILL — LEVOTHYROXINE 150 MCG TAB: 150 | 30 days supply | Qty: 30 | Fill #3

## 2018-05-15 MED FILL — LISINOPRIL 20 MG TAB: 20 | 30 days supply | Qty: 30 | Fill #0

## 2018-06-24 MED FILL — LISINOPRIL 20 MG TAB: 20 | 30 days supply | Qty: 30 | Fill #1

## 2018-06-24 MED FILL — LEVOTHYROXINE 150 MCG TAB: 150 | 30 days supply | Qty: 30 | Fill #4

## 2018-07-24 ENCOUNTER — Ambulatory Visit: Payer: Self-pay | Attending: Family Medicine

## 2018-07-25 ENCOUNTER — Encounter: Payer: Self-pay | Admitting: Internal Medicine

## 2018-07-25 ENCOUNTER — Ambulatory Visit: Payer: Self-pay | Attending: Internal Medicine | Admitting: Internal Medicine

## 2018-07-25 VITALS — BP 156/78 | HR 62 | Temp 98.6°F | Resp 16 | Wt 169.6 lb

## 2018-07-25 DIAGNOSIS — E669 Obesity, unspecified: Secondary | ICD-10-CM | POA: Insufficient documentation

## 2018-07-25 DIAGNOSIS — E039 Hypothyroidism, unspecified: Secondary | ICD-10-CM

## 2018-07-25 DIAGNOSIS — Z9851 Tubal ligation status: Secondary | ICD-10-CM | POA: Insufficient documentation

## 2018-07-25 DIAGNOSIS — Z79899 Other long term (current) drug therapy: Secondary | ICD-10-CM | POA: Insufficient documentation

## 2018-07-25 DIAGNOSIS — I10 Essential (primary) hypertension: Secondary | ICD-10-CM

## 2018-07-25 DIAGNOSIS — Z1239 Encounter for other screening for malignant neoplasm of breast: Secondary | ICD-10-CM

## 2018-07-25 DIAGNOSIS — Z6831 Body mass index (BMI) 31.0-31.9, adult: Secondary | ICD-10-CM | POA: Insufficient documentation

## 2018-07-25 DIAGNOSIS — E785 Hyperlipidemia, unspecified: Secondary | ICD-10-CM | POA: Insufficient documentation

## 2018-07-25 DIAGNOSIS — R7303 Prediabetes: Secondary | ICD-10-CM | POA: Insufficient documentation

## 2018-07-25 DIAGNOSIS — Z79890 Hormone replacement therapy (postmenopausal): Secondary | ICD-10-CM | POA: Insufficient documentation

## 2018-07-25 DIAGNOSIS — Z882 Allergy status to sulfonamides status: Secondary | ICD-10-CM | POA: Insufficient documentation

## 2018-07-25 DIAGNOSIS — Z124 Encounter for screening for malignant neoplasm of cervix: Secondary | ICD-10-CM

## 2018-07-25 DIAGNOSIS — Z888 Allergy status to other drugs, medicaments and biological substances status: Secondary | ICD-10-CM | POA: Insufficient documentation

## 2018-07-25 MED ORDER — LISINOPRIL 30 MG PO TABS: 30.0000 mg | ORAL_TABLET | Freq: Every day | ORAL | 3 refills | Status: DC

## 2018-07-25 MED FILL — LISINOPRIL 30 MG TABLET: 30 | 30 days supply | Qty: 30 | Fill #0

## 2018-07-25 NOTE — Progress Notes (Signed)
Patient ID: Caroline Oneal, female    DOB: August 01, 1960  MRN: [id number]  CC: Blood Pressure Check and Gynecologic Exam   Subjective: Caroline Oneal is a 58 y.o. female with a PMH of HTN, HL, pre-DM(A1C 6.24 November 2016) who presents for BP follow up and Pap smear.   GYN:  She has no concerns for this visit. She denies new sex partners/sexual activity, abnormal vaginal discharge, rash or lesions. She denies testing for STIs. She denies mass, lumps, nipple discharge or swollen lymph nodes. She denies family history of uterine or cervical cancer.  HTN - Lisinopril increased last visit to 20 mg. She states she has been complaint with medication and low sodium diet. She states she took her lisinopril today. Denies chest pain, shortness of breath, difficulty with speech and pedal edema. She is not physically active.  [facility] Past Medical History:  Diagnosis Date  . HTN (hypertension) 10/13/2011  . Thyroid disease Dx 2016  . Tubal ligation status 10/13/2011  . Tubal ligation status 10/13/2011      Patient Active Problem List   Diagnosis Date Noted  . Hyperlipidemia 11/18/2017  . Cough 12/14/2016  . Poor dentition 08/04/2015  . Allergic rhinitis 01/13/2015  . Obesity 06/03/2014  . Prediabetes 06/03/2014  . Hypothyroidism 06/03/2014  . HTN (hypertension) 10/13/2011     Current Outpatient Medications on File Prior to Visit  Medication Sig Dispense Refill  . atorvastatin (LIPITOR) 10 MG tablet Take 1 tablet (10 mg total) by mouth daily. 90 tablet 3  . levothyroxine (SYNTHROID, LEVOTHROID) 150 MCG tablet Take 1 tablet (150 mcg total) by mouth daily before breakfast. 30 tablet 11  . lisinopril (PRINIVIL,ZESTRIL) 20 MG tablet Take 1 tablet (20 mg total) by mouth daily. 90 tablet 3  . Multiple Vitamin (MULTIVITAMIN WITH MINERALS) TABS tablet Take 1 tablet by mouth daily.     No current facility-administered medications on file prior to visit.     Allergies  Allergen  Reactions  . Hctz [Hydrochlorothiazide] Other (See Comments)    Chest pain   . Norvasc [Amlodipine Besylate]     CP and dizziness    Social History   Socioeconomic History  . Marital status: Single    Spouse name: Not on file  . Number of children: Not on file  . Years of education: Not on file  . Highest education level: Not on file  Occupational History  . Not on file  Social Needs  . Financial resource strain: Not on file  . Food insecurity:    Worry: Not on file    Inability: Not on file  . Transportation needs:    Medical: Not on file    Non-medical: Not on file  Tobacco Use  . Smoking status: Never Smoker  . Smokeless tobacco: Never Used  Substance and Sexual Activity  . Alcohol use: No  . Drug use: No  . Sexual activity: Yes    Partners: Male    Birth control/protection: Surgical    Comment: tubal  Lifestyle  . Physical activity:    Days per week: Not on file    Minutes per session: Not on file  . Stress: Not on file  Relationships  . Social connections:    Talks on phone: Not on file    Gets together: Not on file    Attends religious service: Not on file    Active member of club or organization: Not on file    Attends meetings of clubs  or organizations: Not on file    Relationship status: Not on file  . Intimate partner violence:    Fear of current or ex partner: Not on file    Emotionally abused: Not on file    Physically abused: Not on file    Forced sexual activity: Not on file  Other Topics Concern  . Not on file  Social History Narrative  . Not on file    No family history on file.  Past Surgical History:  Procedure Laterality Date  . TUBAL LIGATION      ROS: Review of Systems  Constitutional: Negative for chills, fever and unexpected weight change.  Eyes: Negative for visual disturbance.  Respiratory: Negative for cough, chest tightness, shortness of breath and wheezing.   Cardiovascular: Negative for chest pain, palpitations and  leg swelling.  Genitourinary: Negative for dysuria, genital sores, pelvic pain, vaginal bleeding, vaginal discharge and vaginal pain.  Musculoskeletal: Negative for myalgias.  Skin: Negative for rash and wound.  Neurological: Negative for facial asymmetry and speech difficulty.    PHYSICAL EXAM: BP (!) 156/78   Pulse 62   Temp 98.6 F (37 C) (Oral)   Resp 16   Wt 169 lb 9.6 oz (76.9 kg)   SpO2 98%   BMI 31.02 kg/m   Physical Exam Vitals signs reviewed. Exam conducted with a chaperone present.  Constitutional:      General: She is not in acute distress.    Appearance: Normal appearance. She is obese.  Cardiovascular:     Rate and Rhythm: Normal rate and regular rhythm.     Pulses: Normal pulses.     Heart sounds: Normal heart sounds. No murmur. No friction rub. No gallop.   Abdominal:     Palpations: There is no mass.     Hernia: There is no hernia in the right inguinal area or left inguinal area.  Genitourinary:    General: Normal vulva.     Exam position: Lithotomy position.     Pubic Area: No rash or pubic lice.      Labia:        Right: No rash, tenderness, lesion or injury.        Left: No rash, tenderness, lesion or injury.      Urethra: No prolapse, urethral pain, urethral swelling or urethral lesion.     Vagina: No vaginal discharge.     Rectum: Normal.     Comments: Cervix appears grossly normal.  No cervical motion tenderness or adnexal masses.  Uterus feels normal size and consistency. Lymphadenopathy:     Lower Body: No right inguinal adenopathy. No left inguinal adenopathy.  Skin:    General: Skin is warm and dry.     Capillary Refill: Capillary refill takes less than 2 seconds.     Findings: No lesion or rash.  Neurological:     General: No focal deficit present.     Mental Status: She is alert and oriented to person, place, and time.  Psychiatric:        Mood and Affect: Mood normal.        Behavior: Behavior normal.        Thought Content: Thought  content normal.        Judgment: Judgment normal.    ASSESSMENT AND PLAN: 1. Pap smear for cervical cancer screening Results Pending - Cytology - PAP - Cytology sent; results pending  2. Essential hypertension Uncontrolled.  Increase lisinopril to 30 mg daily. - Dash Diet/Life Style  modifications discussed. She verbalizes understanding. - lisinopril (PRINIVIL,ZESTRIL) 30 MG tablet; Take 1 tablet (30 mg total) by mouth daily.  Dispense: 30 tablet; Refill: 3  3. Breast cancer screening - MM Digital Screening; Future - Ambulatory referral for mammogram sent.   4. Hypothyroidism, unspecified type Asymptomatic - Due for TSH - Will order TSH      Patient was given the opportunity to ask questions.  Patient verbalized understanding of the plan and was able to repeat key elements of the plan.   No orders of the defined types were placed in this encounter.    Requested Prescriptions    No prescriptions requested or ordered in this encounter    No follow-ups on file. Follow-Up in 3 months for chronic disease management.  Jonah Blue, MD, FACP

## 2018-07-26 LAB — TSH: TSH: 1.69 u[IU]/mL (ref 0.450–4.500)

## 2018-07-29 ENCOUNTER — Telehealth: Payer: Self-pay

## 2018-07-29 LAB — CYTOLOGY - PAP
Diagnosis: NEGATIVE
HPV: NOT DETECTED

## 2018-07-29 NOTE — Telephone Encounter (Signed)
[employer]  Id# [phone]  contacted pt to go over lab results pt didn't answer was unable to lvm due to vm not being set up

## 2018-08-07 MED FILL — LEVOTHYROXINE 150 MCG TAB: 150 | 30 days supply | Qty: 30 | Fill #5

## 2018-08-07 MED FILL — LISINOPRIL 20 MG TAB: 20 | 30 days supply | Qty: 30 | Fill #2

## 2018-09-02 MED FILL — LISINOPRIL 30 MG TABLET: 30 | 30 days supply | Qty: 30 | Fill #0

## 2018-09-04 ENCOUNTER — Telehealth: Payer: Self-pay | Admitting: Internal Medicine

## 2018-09-04 NOTE — Telephone Encounter (Signed)
PT left in the office a dental form for [place] to process, I informed her that don't do anything with that that I will find out with [facility] and call her back with an answer

## 2018-09-24 MED FILL — LEVOTHYROXINE 150 MCG TAB: 150 | 30 days supply | Qty: 30 | Fill #6

## 2018-09-30 MED FILL — LISINOPRIL 30 MG TABLET: 30 | 30 days supply | Qty: 30 | Fill #1

## 2018-10-25 ENCOUNTER — Ambulatory Visit: Payer: Self-pay | Attending: Internal Medicine | Admitting: Internal Medicine

## 2018-10-25 ENCOUNTER — Other Ambulatory Visit: Payer: Self-pay

## 2018-11-08 MED FILL — LISINOPRIL 30 MG TABLET: 30 | 30 days supply | Qty: 30 | Fill #2

## 2018-11-08 MED FILL — LEVOTHYROXINE 150 MCG TAB: 150 | 30 days supply | Qty: 30 | Fill #7

## 2018-11-12 ENCOUNTER — Ambulatory Visit: Payer: Self-pay | Attending: Internal Medicine | Admitting: Internal Medicine

## 2018-11-12 ENCOUNTER — Other Ambulatory Visit: Payer: Self-pay

## 2018-11-12 DIAGNOSIS — Z91199 Patient's noncompliance with other medical treatment and regimen due to unspecified reason: Secondary | ICD-10-CM

## 2018-11-12 DIAGNOSIS — Z5329 Procedure and treatment not carried out because of patient's decision for other reasons: Secondary | ICD-10-CM

## 2018-11-12 NOTE — Progress Notes (Signed)
Attempted to reach patient using [employer].  Patient did not pick up and voicemail box was not set up to allow [place] to leave a message.

## 2018-12-23 ENCOUNTER — Other Ambulatory Visit: Payer: Self-pay | Admitting: Internal Medicine

## 2018-12-23 DIAGNOSIS — E039 Hypothyroidism, unspecified: Secondary | ICD-10-CM

## 2018-12-23 MED FILL — LISINOPRIL 30 MG TABLET: 30 | 30 days supply | Qty: 30 | Fill #3

## 2018-12-25 ENCOUNTER — Other Ambulatory Visit: Payer: Self-pay | Admitting: Internal Medicine

## 2018-12-25 DIAGNOSIS — E039 Hypothyroidism, unspecified: Secondary | ICD-10-CM

## 2018-12-30 ENCOUNTER — Other Ambulatory Visit: Payer: Self-pay | Admitting: Internal Medicine

## 2018-12-30 DIAGNOSIS — E039 Hypothyroidism, unspecified: Secondary | ICD-10-CM

## 2019-01-02 ENCOUNTER — Telehealth: Payer: Self-pay | Admitting: Internal Medicine

## 2019-01-02 DIAGNOSIS — E039 Hypothyroidism, unspecified: Secondary | ICD-10-CM

## 2019-01-02 MED ORDER — LEVOTHYROXINE SODIUM 150 MCG PO TABS: 150.0000 ug | ORAL_TABLET | Freq: Every day | ORAL | 0 refills | Status: DC

## 2019-01-02 NOTE — Telephone Encounter (Signed)
1) Medication(s) Requested (by name): synthroid 2) Pharmacy of Choice: [facility] 3) Special Requests:   Approved medications will be sent to the pharmacy, we will reach out if there is an issue.  Requests made after 3pm may not be addressed until the following business day!  If a patient is unsure of the name of the medication(s) please note and ask patient to call back when they are able to provide all info, do not send to responsible party until all information is available!

## 2019-01-03 MED FILL — LEVOTHYROXINE 150 MCG TAB: 150 | 30 days supply | Qty: 30 | Fill #0

## 2019-01-09 ENCOUNTER — Ambulatory Visit: Payer: Self-pay | Admitting: Family Medicine

## 2019-01-10 ENCOUNTER — Encounter: Payer: Self-pay | Admitting: Family Medicine

## 2019-01-10 ENCOUNTER — Ambulatory Visit: Payer: Self-pay | Attending: Family Medicine | Admitting: Family Medicine

## 2019-01-10 ENCOUNTER — Other Ambulatory Visit: Payer: Self-pay

## 2019-01-10 DIAGNOSIS — I10 Essential (primary) hypertension: Secondary | ICD-10-CM

## 2019-01-10 DIAGNOSIS — E039 Hypothyroidism, unspecified: Secondary | ICD-10-CM

## 2019-01-10 MED ORDER — LISINOPRIL 30 MG PO TABS: 30.0000 mg | ORAL_TABLET | Freq: Every day | ORAL | 3 refills | Status: DC

## 2019-01-10 MED ORDER — LEVOTHYROXINE SODIUM 150 MCG PO TABS: 150.0000 ug | ORAL_TABLET | Freq: Every day | ORAL | 3 refills | Status: DC

## 2019-01-10 NOTE — Progress Notes (Signed)
   Virtual Visit via Telephone Note  I connected with Caroline Oneal on 01/10/19 at  4:10 PM EDT by telephone and verified that I am speaking with the correct person using two identifiers.   I discussed the limitations, risks, security and privacy concerns of performing an evaluation and management service by telephone and the availability of in person appointments. I also discussed with the patient that there may be a patient responsible charge related to this service. The patient expressed understanding and agreed to proceed.  Patient Location: Home Provider Location: [facility] office Others participating in call: call initiated by Doloris Hall, CMA who also obtained Spanish speaking interpreter through [facility] interpretation services due to a language barrier   History of Present Illness:      58 yo female who needs a refill on her medications for control of her blood pressure as well as for her thyroid disorder.  She denies any headaches or dizziness related to her blood pressure and she has had no issues with the use of blood pressure medication.  She reports that she has been compliant with her medications for her blood pressure and thyroid.  She denies any increased fatigue, no swelling in her extremities and no constipation related to her hypothyroidism.  She states that overall she feels well.  No recent fever or chills, no shortness of breath or cough, no chest pain or palpitations.  No urinary symptoms.   Past Medical History:  Diagnosis Date  . HTN (hypertension) 10/13/2011  . Thyroid disease Dx 2016  . Tubal ligation status 10/13/2011  . Tubal ligation status 10/13/2011    Past Surgical History:  Procedure Laterality Date  . TUBAL LIGATION      History reviewed. No pertinent family history.  Social History   Tobacco Use  . Smoking status: Never Smoker  . Smokeless tobacco: Never Used  Substance Use Topics  . Alcohol use: No  . Drug use: No     Allergies  Allergen  Reactions  . Hctz [Hydrochlorothiazide] Other (See Comments)    Chest pain   . Norvasc [Amlodipine Besylate]     CP and dizziness       Observations/Objective: No vital signs or physical exam conducted as visit was done via telephone  Assessment and Plan: 1. Hypothyroidism, unspecified type On review of chart, patient with a normal TSH earlier this year and she reports no current symptoms associated with hypothyroidism.  New prescription provided for refill of levothyroxine - levothyroxine (SYNTHROID) 150 MCG tablet; Take 1 tablet (150 mcg total) by mouth daily before breakfast.  Dispense: 30 tablet; Refill: 3  2. Essential hypertension Patient will continue lisinopril 30 mg for control of blood pressure and low-sodium/DASH diet encouraged. - lisinopril (ZESTRIL) 30 MG tablet; Take 1 tablet (30 mg total) by mouth daily. To lower blood pressure  Dispense: 30 tablet; Refill: 3  Follow Up Instructions:Return in about 4 months (around 05/13/2019) for HTN/Thyroid.    I discussed the assessment and treatment plan with the patient. The patient was provided an opportunity to ask questions and all were answered. The patient agreed with the plan and demonstrated an understanding of the instructions.   The patient was advised to call back or seek an in-person evaluation if the symptoms worsen or if the condition fails to improve as anticipated.  I provided 11 minutes of non-face-to-face time during this encounter.   Antony Blackbird, MD

## 2019-01-10 NOTE — Progress Notes (Signed)
Patient has been called and DOB has been verified. Patient has been screened and transferred to PCP to start phone visit.   Patient needs refill on BP medication.

## 2019-01-22 MED FILL — LISINOPRIL 30 MG TABS: 30 | 30 days supply | Qty: 30 | Fill #0

## 2019-01-24 ENCOUNTER — Other Ambulatory Visit: Payer: Self-pay

## 2019-01-24 ENCOUNTER — Ambulatory Visit: Payer: Self-pay | Attending: Internal Medicine

## 2019-02-05 ENCOUNTER — Telehealth: Payer: Self-pay | Admitting: Internal Medicine

## 2019-02-05 NOTE — Telephone Encounter (Signed)
Please contact pt and make aware that she can pick up medication list tomorrow

## 2019-02-05 NOTE — Telephone Encounter (Signed)
Pt would like letter that includes all her medications she is currently on, and the reason she's taking this medications ..please follow up

## 2019-02-05 NOTE — Telephone Encounter (Signed)
Called pt to let her know medication list was ready to be picked up. Pt stated she'll come by to pick it up

## 2019-02-10 MED FILL — LEVOTHYROXINE 150 MCG TAB: 150 | 30 days supply | Qty: 30 | Fill #0

## 2019-03-18 MED FILL — LISINOPRIL 30 MG TABS: 30 | 30 days supply | Qty: 30 | Fill #1

## 2019-03-24 MED FILL — LEVOTHYROXINE 150 MCG TAB: 150 | 30 days supply | Qty: 30 | Fill #1

## 2019-03-26 NOTE — Progress Notes (Deleted)
Virtual Visit via Telephone Note  I connected with Caroline Oneal on 03/26/19 at  2:30 PM EDT by telephone and verified that I am speaking with the correct person using two identifiers.   I discussed the limitations, risks, security and privacy concerns of performing an evaluation and management service by telephone and the availability of in person appointments. I also discussed with the patient that there may be a patient responsible charge related to this service. The patient expressed understanding and agreed to proceed.  Patient location: My Location:  [place] office Persons on the call:   History of Present Illness:    Observations/Objective:   Assessment and Plan:   Follow Up Instructions:    I discussed the assessment and treatment plan with the patient. The patient was provided an opportunity to ask questions and all were answered. The patient agreed with the plan and demonstrated an understanding of the instructions.   The patient was advised to call back or seek an in-person evaluation if the symptoms worsen or if the condition fails to improve as anticipated.  I provided *** minutes of non-face-to-face time during this encounter.   Freeman Caldron, PA-C  Patient ID: Caroline Oneal, female   DOB: 17-Mar-1961, 58 y.o.   MRN: [id number]

## 2019-03-27 ENCOUNTER — Ambulatory Visit: Payer: Self-pay | Attending: Internal Medicine

## 2019-04-07 ENCOUNTER — Other Ambulatory Visit: Payer: Self-pay

## 2019-04-07 DIAGNOSIS — Z20822 Contact with and (suspected) exposure to covid-19: Secondary | ICD-10-CM

## 2019-04-08 LAB — NOVEL CORONAVIRUS, NAA: SARS-CoV-2, NAA: NOT DETECTED

## 2019-04-25 ENCOUNTER — Other Ambulatory Visit: Payer: Self-pay

## 2019-04-25 ENCOUNTER — Telehealth: Payer: Self-pay | Admitting: Internal Medicine

## 2019-04-25 ENCOUNTER — Ambulatory Visit: Payer: Self-pay | Admitting: Internal Medicine

## 2019-04-25 NOTE — Telephone Encounter (Signed)
Called patient twice because she had a complaint. No VM was set up.

## 2019-05-15 ENCOUNTER — Ambulatory Visit: Payer: Self-pay | Admitting: Internal Medicine

## 2019-05-20 MED FILL — LEVOTHYROXINE 150 MCG TAB: 150 | 30 days supply | Qty: 30 | Fill #2

## 2019-05-20 MED FILL — LISINOPRIL 30 MG TABS: 30 | 30 days supply | Qty: 30 | Fill #2

## 2019-06-02 ENCOUNTER — Ambulatory Visit: Payer: Self-pay

## 2019-06-12 ENCOUNTER — Encounter: Payer: Self-pay | Admitting: Internal Medicine

## 2019-06-12 ENCOUNTER — Other Ambulatory Visit: Payer: Self-pay

## 2019-06-12 ENCOUNTER — Ambulatory Visit: Payer: Self-pay | Attending: Internal Medicine | Admitting: Internal Medicine

## 2019-06-12 VITALS — BP 147/84 | HR 59 | Resp 16 | Wt 180.4 lb

## 2019-06-12 DIAGNOSIS — I10 Essential (primary) hypertension: Secondary | ICD-10-CM

## 2019-06-12 DIAGNOSIS — R7303 Prediabetes: Secondary | ICD-10-CM

## 2019-06-12 DIAGNOSIS — E669 Obesity, unspecified: Secondary | ICD-10-CM

## 2019-06-12 DIAGNOSIS — E039 Hypothyroidism, unspecified: Secondary | ICD-10-CM

## 2019-06-12 DIAGNOSIS — E785 Hyperlipidemia, unspecified: Secondary | ICD-10-CM

## 2019-06-12 MED ORDER — LISINOPRIL 30 MG PO TABS: 30.0000 mg | ORAL_TABLET | Freq: Every day | ORAL | 6 refills | Status: DC

## 2019-06-12 MED ORDER — LEVOTHYROXINE SODIUM 150 MCG PO TABS: 150.0000 ug | ORAL_TABLET | Freq: Every day | ORAL | 3 refills | Status: DC

## 2019-06-12 MED FILL — LISINOPRIL 30 MG TABS: 30 | 30 days supply | Qty: 30 | Fill #0

## 2019-06-12 NOTE — Patient Instructions (Signed)
Plan de alimentacin para personas con prediabetes Prediabetes Eating Plan La prediabetes es una afeccin que hace que los niveles de azcar en la sangre (glucosa) sean ms altos de lo normal. Esto aumenta el riesgo de tener diabetes. Para prevenir la diabetes, es posible que su mdico le recomiende cambios en la dieta y otros cambios en su estilo de vida que lo ayuden a lograr lo siguiente:  Controlar los niveles de glucemia.  Mejorar los niveles de colesterol.  Controlar la presin arterial. El mdico puede recomendarle que trabaje con un especialista en alimentacin y nutricin (nutricionista) para elaborar el plan de comidas ms conveniente para usted. Consejos para seguir este plan: Estilo de vida  Establezca metas para bajar de peso con la ayuda de su equipo de atencin mdica. A la mayora de las personas con prediabetes se les recomienda bajar un 7% de su peso corporal.  Haga ejercicio al menos 30minutos 5das por semana, como mnimo.  Asista a un grupo de apoyo o solicite el apoyo continuo de un consejero de salud mental.  Tome los medicamentos de venta libre y los recetados solamente como se lo haya indicado el mdico. Leer las etiquetas de los alimentos  Lea las etiquetas de los alimentos envasados para controlar la cantidad de grasa, sal (sodio) y azcar que contienen. Evite los alimentos que contengan lo siguiente: ? Grasas saturadas. ? Grasas trans. ? Azcares agregados.  Evite los alimentos que contengan ms de 300miligramos(mg) de sodio por porcin. Limite el consumo diario de sodio a menos de 2300mg por da. De compras  Evite comprar alimentos procesados y preelaborados. Coccin  Cocine con aceite de oliva. No use mantequilla, manteca de cerdo o mantequilla clarificada.  Cocine los alimentos al horno, a la parrilla, asados o hervidos. Evite frerlos. Planificacin de las comidas   Trabaje con el nutricionista para crear un plan de alimentacin que sea  adecuado para usted. Esto puede incluir lo siguiente: ? Registro de la cantidad de caloras que ingiere. Use un registro de alimentos, un cuaderno o una aplicacin mvil para anotar lo que comi en cada comida. ? Uso del ndice glucmico (IG) para planificar las comidas. El ndice muestra con qu rapidez elevar la glucemia un alimento. Elija alimentos con bajo IG. Estos demoran ms en elevar la glucemia.  Considere la posibilidad de seguir una dieta mediterrnea. Esta dieta incluye lo siguiente: ? Varias porciones de frutas y verduras frescas por da. ? Pescado al menos dos veces por semana. ? Varias porciones de cereales integrales, frijoles, frutos secos y semillas por da. ? Aceite de oliva en lugar de otras grasas. ? Consumo moderado de alcohol. ? Pequeas cantidades de carnes rojas y lcteos enteros.  Si tiene hipertensin arterial, quizs deba limitar el consumo de sodio o seguir una dieta como el plan de alimentacin basado en Enfoques Alimentarios para Detener la Hipertensin (Dietary Approaches to Stop Hypertension, DASH). Este es un plan de alimentacin cuyo objetivo es bajar la hipertensin arterial. Qu alimentos se recomiendan? Es posible que los alimentos incluidos a continuacin no constituyan la lista completa. Hable con el nutricionista sobre las mejores opciones alimenticias para usted. Cereales Productos integrales, como panes, galletas, cereales y pastas de salvado o integrales. Avena sin azcar. Trigo burgol. Cebada. Quinua. Arroz integral. Tacos o tortillas de harina de maz o de salvado. Verduras Lechuga. Espinaca. Guisantes. Remolachas. Coliflor. Repollo. Brcoli. Zanahorias. Tomates. Calabaza. Berenjena. Hierbas. Pimienta. Cebollas. Pepinos. Repollitos de Bruselas. Frutas Frutos rojos. Bananas. Manzanas. Naranjas. Uvas. Papaya. Mango. Granada. Kiwi. Pomelo.   Cerezas. Carnes y otros alimentos ricos en protenas Mariscos. Carne de ave sin piel. Cortes magros de cerdo y  carne de res. Tofu. Huevos. Frutos secos. Frijoles. Lcteos Productos lcteos descremados o semidescremados, como yogur, queso cottage y queso. Bebidas Agua. T. Caf. Gaseosas sin azcar o dietticas. Soda. Leche descremada o semidescremada. Productos alternativos a la leche, como leche de soja o de almendras. Grasas y aceites Aceite de oliva. Aceite de canola. Aceite de girasol. Aceite de semillas de uva. Aguacate. Nueces. Dulces y postres Pudin sin azcar o con bajo contenido de grasa. Helado y otros postres congelados sin azcar o con bajo contenido de grasa. Condimentos y otros alimentos Hierbas. Especias sin sodio. Mostaza. Salsa de pepinillos. Ktchup con bajo contenido de grasa y de azcar. Salsa barbacoa con bajo contenido de grasa y de azcar. Mayonesa con bajo contenido de grasa o sin grasa. Qu alimentos no se recomiendan? Es posible que los alimentos incluidos a continuacin no constituyan la lista completa. Hable con el nutricionista sobre las mejores opciones alimenticias para usted. Cereales Productos elaborados con harina y harina blanca refinada, como panes, pastas, bocadillos y cereales. Verduras Verduras enlatadas. Verduras congeladas con mantequilla o salsa de crema. Frutas Frutas enlatadas al almbar. Carnes y otros alimentos ricos en protenas Cortes de carne con grasa. Carne de ave con piel. Carne empanizada o frita. Carnes procesadas. Lcteos Yogur, queso o leche enteros. Bebidas Bebidas azucaradas, como t helado dulce y gaseosas. Grasas y aceites Mantequilla. Manteca de cerdo. Mantequilla clarificada. Dulces y postres Productos horneados, como pasteles, pastelitos, galletas dulces y tarta de queso. Condimentos y otros alimentos Mezclas de especias con sal agregada. Ktchup. Salsa barbacoa. Mayonesa. Resumen  Para prevenir la diabetes, es posible que deba hacer cambios en la dieta y otros cambios en su estilo de vida para ayudar a controlar el azcar en la  sangre, mejorar los niveles de colesterol y controlar la presin arterial.  Establezca metas para bajar de peso con la ayuda de su equipo de atencin mdica. A la mayora de las personas con prediabetes se les recomienda bajar un 7por ciento de su peso corporal.  Considere la posibilidad de seguir una dieta mediterrnea que incluya muchas frutas y verduras frescas, cereales integrales, frijoles, frutos secos, semillas, pescado, carnes magras, lcteos descremados y aceites saludables. Esta informacin no tiene como fin reemplazar el consejo del mdico. Asegrese de hacerle al mdico cualquier pregunta que tenga. Document Released: 03/03/2015 Document Revised: 12/25/2016 Document Reviewed: 12/25/2016 Elsevier Patient Education  2020 Elsevier Inc.  

## 2019-06-12 NOTE — Progress Notes (Signed)
Patient ID: Caroline Oneal, female    DOB: [birth year]  MRN: [id number]  CC: Hypertension   Subjective: Caroline Oneal is a 58 y.o. female who presents for chronic ds management Her concerns today include:  HTN, HL, hypothyroidism, pre-DM(A1C 6.24 November 2016)   HYPERTENSION Currently taking: see medication list -on lisinopril 30 mg daily. Med Adherence: [x]  Yes -but did not take as yet today.  Last dose was yesterday    Medication side effects: []  Yes    [x]  No Adherence with salt restriction: [x]  Yes    []  No Home Monitoring?: []  Yes    [x]  No Monitoring Frequency: []  Yes    []  No Home BP results range: []  Yes    []  No SOB? []  Yes    [x]  No Chest Pain?: []  Yes    [x]  No Leg swelling?: []  Yes    [x]  No Headaches?: []  Yes    [x]  No Dizziness? []  Yes    [x]  No Comments:   Thyroid:  Reports compliance with Levothyroxine.  She reports no wgh changes but today based on our scale she is up 11 lbs since 06/2018.  No palpitations  HL: not taking Lipitor.  Prescribed Lipitor 10/2017 but pt states she does not recall being told she needs to be on med for  It.  She is willing to take it if recommend.  PreDM/obesity:   seeing a doctor in [place] to help with wgh.  She does not recall details of whether this person is a dietitian or weight management program.  She tells me she has been seen 2 x already.  States she was told "to diet a lot."  Changes she has made so far - dec sodas, rice, bread.  Not getting in any exercise but plans to start.  Patient Active Problem List   Diagnosis Date Noted  . Hyperlipidemia 11/18/2017  . Cough 12/14/2016  . Poor dentition 08/04/2015  . Allergic rhinitis 01/13/2015  . Obesity 06/03/2014  . Prediabetes 06/03/2014  . Hypothyroidism 06/03/2014  . HTN (hypertension) 10/13/2011     Current Outpatient Medications on File Prior to Visit  Medication Sig Dispense Refill  . Multiple Vitamin (MULTIVITAMIN WITH MINERALS) TABS tablet Take 1  tablet by mouth daily.     No current facility-administered medications on file prior to visit.    Allergies  Allergen Reactions  . Hctz [Hydrochlorothiazide] Other (See Comments)    Chest pain   . Norvasc [Amlodipine Besylate]     CP and dizziness    Social History   Socioeconomic History  . Marital status: Single    Spouse name: Not on file  . Number of children: Not on file  . Years of education: Not on file  . Highest education level: Not on file  Occupational History  . Not on file  Tobacco Use  . Smoking status: Never Smoker  . Smokeless tobacco: Never Used  Substance and Sexual Activity  . Alcohol use: No  . Drug use: No  . Sexual activity: Yes    Partners: Male    Birth control/protection: Surgical    Comment: tubal  Other Topics Concern  . Not on file  Social History Narrative  . Not on file   Social Determinants of Health   Financial Resource Strain:   . Difficulty of Paying Living Expenses: Not on file  Food Insecurity:   . Worried About Programme researcher, broadcasting/film/videounning Out of Food in the Last Year: Not on file  .  Ran Out of Food in the Last Year: Not on file  Transportation Needs:   . Lack of Transportation (Medical): Not on file  . Lack of Transportation (Non-Medical): Not on file  Physical Activity:   . Days of Exercise per Week: Not on file  . Minutes of Exercise per Session: Not on file  Stress:   . Feeling of Stress : Not on file  Social Connections:   . Frequency of Communication with Friends and Family: Not on file  . Frequency of Social Gatherings with Friends and Family: Not on file  . Attends Religious Services: Not on file  . Active Member of Clubs or Organizations: Not on file  . Attends Archivist Meetings: Not on file  . Marital Status: Not on file  Intimate Partner Violence:   . Fear of Current or Ex-Partner: Not on file  . Emotionally Abused: Not on file  . Physically Abused: Not on file  . Sexually Abused: Not on file    No family  history on file.  Past Surgical History:  Procedure Laterality Date  . TUBAL LIGATION      ROS: Review of Systems Negative except as stated above  PHYSICAL EXAM: BP (!) 147/84   Pulse (!) 59   Resp 16   Wt 180 lb 6.4 oz (81.8 kg)   SpO2 97%   BMI 33.00 kg/m   Wt Readings from Last 3 Encounters:  06/12/19 180 lb 6.4 oz (81.8 kg)  07/25/18 169 lb 9.6 oz (76.9 kg)  03/29/18 174 lb 6.4 oz (79.1 kg)   Physical Exam  General appearance - alert, well appearing, and in no distress Mental status - normal mood, behavior, speech, dress, motor activity, and thought processes Neck - supple, no significant adenopathy Chest - clear to auscultation, no wheezes, rales or rhonchi, symmetric air entry Heart - normal rate, regular rhythm, normal S1, S2, no murmurs, rubs, clicks or gallops Extremities - peripheral pulses normal, no pedal edema, no clubbing or cyanosis   CMP Latest Ref Rng & Units 11/16/2017 03/03/2016 12/16/2015  Glucose 65 - 99 mg/dL 94 105(H) 113(H)  BUN 6 - 24 mg/dL 15 8 9   Creatinine 0.57 - 1.00 mg/dL 0.68 0.66 0.90  Sodium 134 - 144 mmol/L 143 142 138  Potassium 3.5 - 5.2 mmol/L 4.2 3.6 3.5  Chloride 96 - 106 mmol/L 104 105 103  CO2 20 - 29 mmol/L 24 29 27   Calcium 8.7 - 10.2 mg/dL 9.4 9.7 9.5  Total Protein 6.0 - 8.5 g/dL 7.3 - -  Total Bilirubin 0.0 - 1.2 mg/dL 0.4 - -  Alkaline Phos 39 - 117 IU/L 124(H) - -  AST 0 - 40 IU/L 27 - -  ALT 0 - 32 IU/L 25 - -   Lipid Panel     Component Value Date/Time   CHOL 220 (H) 11/16/2017 1027   TRIG 269 (H) 11/16/2017 1027   HDL 46 11/16/2017 1027   CHOLHDL 4.8 (H) 11/16/2017 1027   CHOLHDL 4.0 06/03/2014 1043   VLDL 23 06/03/2014 1043   LDLCALC 120 (H) 11/16/2017 1027    CBC    Component Value Date/Time   WBC 6.7 11/16/2017 1027   WBC 7.9 09/12/2016 1002   RBC 4.69 11/16/2017 1027   RBC 4.53 09/12/2016 1002   HGB 14.1 11/16/2017 1027   HCT 43.2 11/16/2017 1027   PLT 297 11/16/2017 1027   MCV 92 11/16/2017 1027    MCH 30.1 11/16/2017 1027   MCH  30.7 09/12/2016 1002   MCHC 32.6 11/16/2017 1027   MCHC 34.1 09/12/2016 1002   RDW 14.8 11/16/2017 1027   LYMPHSABS 2.3 06/03/2014 1043   MONOABS 0.5 06/03/2014 1043   EOSABS 0.3 06/03/2014 1043   BASOSABS 0.1 06/03/2014 1043    ASSESSMENT AND PLAN: 1. Essential hypertension Not at goal of 130/80 or lower.  She has not taken medicine for today.  Refill given.  She will continue lisinopril and low-salt diet - lisinopril (ZESTRIL) 30 MG tablet; Take 1 tablet (30 mg total) by mouth daily. To lower blood pressure  Dispense: 30 tablet; Refill: 6 - CBC - Comprehensive metabolic panel  2. Hypothyroidism, unspecified type - levothyroxine (SYNTHROID) 150 MCG tablet; Take 1 tablet (150 mcg total) by mouth daily before breakfast.  Dispense: 30 tablet; Refill: 3 - TSH  3. Prediabetes Dietary counseling given. Encouraged her to get in some form of moderate intensity exercise at least 3 to 4 days a week for 30 minutes. - Hemoglobin A1c  4. Obesity (BMI 30-39.9) See #3 above  5. Hyperlipidemia, unspecified hyperlipidemia type I will hold off on the Lipitor and instead recheck a lipid profile.  Based on results and ASCVD risk score we will determine whether she still needs to be on Lipitor. - Lipid panel     Patient was given the opportunity to ask questions.  Patient verbalized understanding of the plan and was able to repeat key elements of the plan.  [employer] interpreter used during this encounter. #[id number]  Orders Placed This Encounter  Procedures  . TSH  . CBC  . Comprehensive metabolic panel  . Lipid panel  . Hemoglobin A1c     Requested Prescriptions   Signed Prescriptions Disp Refills  . lisinopril (ZESTRIL) 30 MG tablet 30 tablet 6    Sig: Take 1 tablet (30 mg total) by mouth daily. To lower blood pressure  . levothyroxine (SYNTHROID) 150 MCG tablet 30 tablet 3    Sig: Take 1 tablet (150 mcg total) by mouth daily before breakfast.     Return in about 4 months (around 10/11/2019).  Jonah Blue, MD, FACP

## 2019-06-13 ENCOUNTER — Ambulatory Visit: Payer: Self-pay

## 2019-06-13 LAB — COMPREHENSIVE METABOLIC PANEL
ALT: 20 IU/L (ref 0–32)
AST: 20 IU/L (ref 0–40)
Albumin/Globulin Ratio: 1.7 (ref 1.2–2.2)
Albumin: 4.3 g/dL (ref 3.8–4.9)
Alkaline Phosphatase: 87 IU/L (ref 39–117)
BUN/Creatinine Ratio: 13 (ref 9–23)
BUN: 10 mg/dL (ref 6–24)
Bilirubin Total: 0.5 mg/dL (ref 0.0–1.2)
CO2: 23 mmol/L (ref 20–29)
Calcium: 9.9 mg/dL (ref 8.7–10.2)
Chloride: 106 mmol/L (ref 96–106)
Creatinine, Ser: 0.75 mg/dL (ref 0.57–1.00)
GFR calc Af Amer: 101 mL/min/{1.73_m2} (ref >59)
GFR calc non Af Amer: 88 mL/min/{1.73_m2} (ref >59)
Globulin, Total: 2.5 g/dL (ref 1.5–4.5)
Glucose: 101 mg/dL — ABNORMAL HIGH (ref 65–99)
Potassium: 4.2 mmol/L (ref 3.5–5.2)
Sodium: 142 mmol/L (ref 134–144)
Total Protein: 6.8 g/dL (ref 6.0–8.5)

## 2019-06-13 LAB — CBC
Hematocrit: 43.4 % (ref 34.0–46.6)
Hemoglobin: 14.4 g/dL (ref 11.1–15.9)
MCH: 30.5 pg (ref 26.6–33.0)
MCHC: 33.2 g/dL (ref 31.5–35.7)
MCV: 92 fL (ref 79–97)
Platelets: 308 10*3/uL (ref 150–450)
RBC: 4.72 x10E6/uL (ref 3.77–5.28)
RDW: 13.5 % (ref 11.7–15.4)
WBC: 6.8 10*3/uL (ref 3.4–10.8)

## 2019-06-13 LAB — LIPID PANEL
Chol/HDL Ratio: 3.9 ratio (ref 0.0–4.4)
Cholesterol, Total: 179 mg/dL (ref 100–199)
HDL: 46 mg/dL (ref >39)
LDL Chol Calc (NIH): 97 mg/dL (ref 0–99)
Triglycerides: 210 mg/dL — ABNORMAL HIGH (ref 0–149)
VLDL Cholesterol Cal: 36 mg/dL (ref 5–40)

## 2019-06-13 LAB — HEMOGLOBIN A1C
Est. average glucose Bld gHb Est-mCnc: 128 mg/dL
Hgb A1c MFr Bld: 6.1 % — ABNORMAL HIGH (ref 4.8–5.6)

## 2019-06-13 LAB — TSH: TSH: 39.6 u[IU]/mL — ABNORMAL HIGH (ref 0.450–4.500)

## 2019-08-04 MED FILL — LISINOPRIL 30 MG TABS: 30 | 30 days supply | Qty: 30 | Fill #0

## 2019-08-04 MED FILL — LEVOTHYROXINE 150 MCG TAB: 150 | 30 days supply | Qty: 30 | Fill #0

## 2019-09-12 ENCOUNTER — Ambulatory Visit: Payer: Self-pay

## 2019-09-22 ENCOUNTER — Other Ambulatory Visit: Payer: Self-pay

## 2019-09-22 ENCOUNTER — Ambulatory Visit: Payer: Self-pay | Attending: Internal Medicine

## 2019-10-14 ENCOUNTER — Other Ambulatory Visit: Payer: Self-pay

## 2019-10-14 ENCOUNTER — Ambulatory Visit: Payer: Self-pay | Attending: Internal Medicine | Admitting: Internal Medicine

## 2019-10-14 DIAGNOSIS — R7303 Prediabetes: Secondary | ICD-10-CM

## 2019-10-14 DIAGNOSIS — E039 Hypothyroidism, unspecified: Secondary | ICD-10-CM

## 2019-10-14 DIAGNOSIS — I10 Essential (primary) hypertension: Secondary | ICD-10-CM

## 2019-10-14 DIAGNOSIS — R221 Localized swelling, mass and lump, neck: Secondary | ICD-10-CM

## 2019-10-14 DIAGNOSIS — E785 Hyperlipidemia, unspecified: Secondary | ICD-10-CM
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
## 2019-10-27 NOTE — Progress Notes (Signed)
Patient ID: Caroline Oneal, female    DOB: 03-16-1961  MRN: [id number]  CC: Head Mass   Subjective: Caroline Oneal is a 59 y.o. female with history of hypertension, allergic rhinitis, poor dentition, hypothyroidism, obesity, prediabetes, cough, and hyperlipidemia who presents for neck mass.   1. LEFT HEAD MASS:  Duration: has been there for a few months Progression: in the past it was bigger but massaging helps and it has gotten smaller Location: left side of head at the back Pain Rating: Last week it was hurting but not today. 8/10 when hurting. Pain only with touching. Has not taken anything for the pain. Duration of Pain: comes and goes Fever: denies  Makes worse: turning head to left  Makes better: self-massage Drainage: denies Swelling of face or eyes: denies Vision changes: yes, blurry vision when reading close, feels like eyes are tired sometimes Hearing changes: denies Headaches: denies Comments: 1 month has not been able to sleep well feeling like she has insomnia.   Patient Active Problem List   Diagnosis Date Noted  . Hyperlipidemia 11/18/2017  . Cough 12/14/2016  . Poor dentition 08/04/2015  . Allergic rhinitis 01/13/2015  . Obesity 06/03/2014  . Prediabetes 06/03/2014  . Hypothyroidism 06/03/2014  . HTN (hypertension) 10/13/2011     Current Outpatient Medications on File Prior to Visit  Medication Sig Dispense Refill  . levothyroxine (SYNTHROID) 150 MCG tablet Take 1 tablet (150 mcg total) by mouth daily before breakfast. 30 tablet 3  . lisinopril (ZESTRIL) 30 MG tablet Take 1 tablet (30 mg total) by mouth daily. To lower blood pressure 30 tablet 6  . Multiple Vitamin (MULTIVITAMIN WITH MINERALS) TABS tablet Take 1 tablet by mouth daily.     No current facility-administered medications on file prior to visit.    Allergies  Allergen Reactions  . Hctz [Hydrochlorothiazide] Other (See Comments)    Chest pain   . Norvasc [Amlodipine  Besylate]     CP and dizziness    Social History   Socioeconomic History  . Marital status: Single    Spouse name: Not on file  . Number of children: Not on file  . Years of education: Not on file  . Highest education level: Not on file  Occupational History  . Not on file  Tobacco Use  . Smoking status: Never Smoker  . Smokeless tobacco: Never Used  Substance and Sexual Activity  . Alcohol use: No  . Drug use: No  . Sexual activity: Yes    Partners: Male    Birth control/protection: Surgical    Comment: tubal  Other Topics Concern  . Not on file  Social History Narrative  . Not on file   Social Determinants of Health   Financial Resource Strain:   . Difficulty of Paying Living Expenses:   Food Insecurity:   . Worried About Charity fundraiser in the Last Year:   . Arboriculturist in the Last Year:   Transportation Needs:   . Film/video editor (Medical):   Marland Kitchen Lack of Transportation (Non-Medical):   Physical Activity:   . Days of Exercise per Week:   . Minutes of Exercise per Session:   Stress:   . Feeling of Stress :   Social Connections:   . Frequency of Communication with Friends and Family:   . Frequency of Social Gatherings with Friends and Family:   . Attends Religious Services:   . Active Member of Clubs or Organizations:   .  Attends Banker Meetings:   Marland Kitchen Marital Status:   Intimate Partner Violence:   . Fear of Current or Ex-Partner:   . Emotionally Abused:   Marland Kitchen Physically Abused:   . Sexually Abused:     No family history on file.  Past Surgical History:  Procedure Laterality Date  . TUBAL LIGATION      ROS: Review of Systems Negative except as stated above  PHYSICAL EXAM: Vitals with BMI 10/30/2019 06/12/2019 07/25/2018  Height - - -  Weight 175 lbs 3 oz 180 lbs 6 oz 169 lbs 10 oz  BMI - - -  Systolic 186 147 782  Diastolic 94 84 78  Pulse 60 59 62  SpO2- 95%, room air  Temperature- 97.5 F, oral   Physical  Exam General appearance - alert, well appearing, and in no distress and oriented to person, place, and time Mental status - alert, oriented to person, place, and time, normal mood, behavior, speech, dress, motor activity, and thought processes Head- spongy immovable mass at left lower posterior head Eyes - pupils equal and reactive, extraocular eye movements intact Ears - bilateral TM's and external ear canals normal Nose - normal and patent, no erythema, discharge or polyps and normal nontender sinuses Mouth - mucous membranes moist, pharynx normal without lesions Neck - supple, no significant adenopathy Lymphatics - no palpable lymphadenopathy, no hepatosplenomegaly Chest - clear to auscultation, no wheezes, rales or rhonchi, symmetric air entry, no tachypnea, retractions or cyanosis Neurological - alert, oriented, normal speech, no focal findings or movement disorder noted, neck supple without rigidity, cranial nerves II through XII intact, funduscopic exam normal, discs flat and sharp, DTR's normal and symmetric, motor and sensory grossly normal bilaterally, normal muscle tone, no tremors, strength 5/5, Romberg sign negative, normal gait and station  ASSESSMENT AND PLAN: 1. Head mass: -Patient has a spongy immovable mass at the posterior aspect of left head. Causing some pain, see #2 below. Will continue to monitor for now to see if this increases in size. If this does not improve may consider CT of head for further evaluation. -Counseled patient to follow-up with primary physician in 7 days. -Seek medical attention immediately if symptoms worsen or become severe. Patient verbalized understanding.  2. Tension-type headache, not intractable, unspecified chronicity pattern: -Will treat patient's pain related to head mass with Aspirin-Acetaminophen-Caffeine 250-250-65 mg tablet every 6 hours as needed for 7 days. -Counseled patient to follow-up with primary physician in 7 days.  -Seek medical  attention immediately if symptoms worsen or become severe. Patient verbalized understanding.  3. Language barrier: -[employer] participated during this visit. Interpreter Name: Debarah Crape, ID#: [id number]. Interpreter Name: Elita Quick, ID#: [id number].   Patient was given the opportunity to ask questions.  Patient verbalized understanding of the plan and was able to repeat key elements of the plan. Patient was given clear instructions to go to Emergency Department or return to medical center if symptoms don't improve, worsen, or new problems develop.The patient verbalized understanding.   Requested Prescriptions    No prescriptions requested or ordered in this encounter     Nicolasa Milbrath Jodi Geralds, NP

## 2019-10-30 ENCOUNTER — Encounter: Payer: Self-pay | Admitting: Family

## 2019-10-30 ENCOUNTER — Other Ambulatory Visit: Payer: Self-pay

## 2019-10-30 ENCOUNTER — Ambulatory Visit: Payer: Self-pay | Attending: Family | Admitting: Family

## 2019-10-30 VITALS — BP 186/94 | HR 60 | Temp 97.5°F | Resp 16 | Wt 175.2 lb

## 2019-10-30 DIAGNOSIS — R220 Localized swelling, mass and lump, head: Secondary | ICD-10-CM

## 2019-10-30 DIAGNOSIS — G44209 Tension-type headache, unspecified, not intractable: Secondary | ICD-10-CM

## 2019-10-30 DIAGNOSIS — Z789 Other specified health status: Secondary | ICD-10-CM

## 2019-10-30 MED ORDER — EXCEDRIN EXTRA STRENGTH 250-250-65 MG PO TABS: 1.0000 | ORAL_TABLET | Freq: Four times a day (QID) | ORAL | 0 refills | Status: AC | PRN

## 2019-10-30 MED FILL — LEVOTHYROXINE SODIUM 150 MC: 150 | 30 days supply | Qty: 30 | Fill #1

## 2019-10-30 MED FILL — LISINOPRIL 30 MG TABS: 30 | 30 days supply | Qty: 30 | Fill #1

## 2019-10-30 NOTE — Patient Instructions (Addendum)
Excedrin Extra Strength para el dolor. Seguimiento con el mdico de cabecera en 1 semana.   Excedrin Extra Strength for pain. Follow-up with primary physician in 1 week. Cefalea tensional en los adultos Tension Headache, Adult La cefalea tensional es un dolor o una presin que se siente en la cabeza. Las cefaleas tensionales pueden durar de 30 minutos a varios das. Siga estas indicaciones en su casa: Control del [employer] de venta libre y los recetados solamente como se lo haya indicado el mdico.  Cuando tenga una cefalea, acustese en una habitacin oscura y silenciosa.  Si se lo indican, aplquese hielo en la cabeza y el cuello: ? Ponga el hielo en una bolsa plstica. ? Coloque una [employer] piel y la bolsa de hielo. ? Coloque el hielo durante 52minutos, 2 o 3veces por da.  Si se lo indican, aplquese calor en la nuca. Hgalo con la frecuencia que le haya dicho el mdico. Use el tipo de calor que el mdico le recomiende, como una compresa de calor hmedo o una almohadilla trmica. ? Coloque una [employer] piel y la fuente de Freight forwarder. ? Aplique el calor durante 20 a 63minutos. ? Retire la fuente de calor si la piel se pone de color rojo brillante. Comida y bebida  Mantenga un horario para las comidas.  Controle la cantidad de alcohol que bebe: ? Si es mujer y no est embarazada, no consuma ms de 1 bebida por Training and development officer. ? Si es hombre, no consuma ms de 2 bebidas por Training and development officer.  Beba suficiente lquido para Contractor pis (orina) de color amarillo plido.  No consuma mucha cafena ni deje de consumirla por completo. Estilo de vida  Duerma lo suficiente. Duerma entre 7y 9horas todas las noches. O duerma la cantidad de [employer] indique el mdico.  A la hora de dormir, retire todos los dispositivos electrnicos de su habitacin. Algunos ejemplos de dispositivos electrnicos son las computadoras, los telfonos y las tabletas.  Encuentre maneras de  Software engineer. Las siguientes son algunas cosas que pueden reducir el estrs: ? Actividad fsica. ? Respiracin profunda. ? Yoga. ? Msica. ? Pensamientos positivos.  Sintese con la espalda recta. No contraiga (tensione) los msculos.  No consuma ningn producto que contenga nicotina o tabaco, como cigarrillos y Psychologist, sport and exercise. Si necesita ayuda para dejar de fumar, consulte al mdico. Instrucciones generales   Concurra a todas las visitas de control como se lo haya indicado el mdico. Esto es importante.  Evite las cosas que puedan provocar las cefaleas. Lleve un registro diario para Neurosurgeon si ciertas cosas provocan los dolores de [place]. Registre, por ejemplo, lo siguiente: ? Lo que usted come y bebe. ? Cunto tiempo duerme. ? Algn cambio en su dieta o en los medicamentos. Comunquese con un mdico si:  La cefalea no se alivia.  La cefalea regresa.  Tiene una cefalea y [employer] sonidos, la luz o los olores.  Tiene malestar estomacal (nuseas) o vomita.  Le duele el estmago. Solicite ayuda de inmediato si:  Siente un dolor de cabeza muy intenso de repente junto con alguno de estos sntomas: ? Rigidez en el cuello. ? Ganas de vomitar. ? Vmitos. ? Sensacin de debilidad. ? Dificultad para ver. ? Falta de aire. ? Erupcin cutnea. ? Somnolencia inusual. ? Dificultad para hablar. ? Dolor en el ojo o el odo. ? Dificultad para caminar o mantener el equilibrio. ? Sensacin de que se desvanecer (Geneticist, molecular). ?  Desmayo. Resumen  La cefalea tensional es un dolor o una presin que se siente en la cabeza.  Las cefaleas tensionales pueden durar de 30 minutos a [address].  Los [employer] en el estilo de vida y los medicamentos pueden ayudar a Engineer, materials. Esta informacin no tiene Theme park manager el consejo del mdico. Asegrese de hacerle al mdico cualquier pregunta que tenga. Document Revised: 01/15/2017 Document Reviewed:  01/15/2017 Elsevier Patient Education  2020 [employer].

## 2019-11-17 ENCOUNTER — Ambulatory Visit: Payer: Self-pay | Admitting: Internal Medicine

## 2019-12-24 MED FILL — LISINOPRIL 30 MG TABS: 30 | 30 days supply | Qty: 30 | Fill #2

## 2019-12-24 MED FILL — LEVOTHYROXINE SODIUM 150 MC: 150 | 30 days supply | Qty: 30 | Fill #2

## 2020-02-13 ENCOUNTER — Ambulatory Visit: Payer: Self-pay | Admitting: Internal Medicine

## 2020-02-16 MED FILL — LISINOPRIL 30 MG TABS: 30 | 30 days supply | Qty: 30 | Fill #3

## 2020-02-16 MED FILL — LEVOTHYROXINE SODIUM 150 MC: 150 | 30 days supply | Qty: 30 | Fill #3

## 2020-04-16 ENCOUNTER — Ambulatory Visit: Payer: Self-pay

## 2020-04-22 ENCOUNTER — Ambulatory Visit: Payer: Self-pay | Attending: Family Medicine | Admitting: Family Medicine

## 2020-04-22 ENCOUNTER — Other Ambulatory Visit: Payer: Self-pay

## 2020-04-22 ENCOUNTER — Encounter: Payer: Self-pay | Admitting: Family Medicine

## 2020-04-22 ENCOUNTER — Other Ambulatory Visit: Payer: Self-pay | Admitting: Family Medicine

## 2020-04-22 VITALS — BP 180/93 | HR 60 | Temp 97.0°F | Ht 60.43 in | Wt 180.0 lb

## 2020-04-22 DIAGNOSIS — I10 Essential (primary) hypertension: Secondary | ICD-10-CM

## 2020-04-22 DIAGNOSIS — Z758 Other problems related to medical facilities and other health care: Secondary | ICD-10-CM

## 2020-04-22 DIAGNOSIS — Z789 Other specified health status: Secondary | ICD-10-CM

## 2020-04-22 DIAGNOSIS — Z23 Encounter for immunization: Secondary | ICD-10-CM

## 2020-04-22 DIAGNOSIS — R7303 Prediabetes: Secondary | ICD-10-CM

## 2020-04-22 DIAGNOSIS — Z603 Acculturation difficulty: Secondary | ICD-10-CM

## 2020-04-22 DIAGNOSIS — E039 Hypothyroidism, unspecified: Secondary | ICD-10-CM

## 2020-04-22 LAB — POCT GLYCOSYLATED HEMOGLOBIN (HGB A1C): Hemoglobin A1C: 6.1 % — AB (ref 4.0–5.6)

## 2020-04-22 MED ORDER — LEVOTHYROXINE SODIUM 150 MCG PO TABS: 150.0000 ug | ORAL_TABLET | Freq: Every day | ORAL | 3 refills | Status: DC

## 2020-04-22 MED ORDER — LISINOPRIL 30 MG PO TABS: 30.0000 mg | ORAL_TABLET | Freq: Every day | ORAL | 3 refills | Status: DC

## 2020-04-22 MED FILL — LISINOPRIL 30 MG TABS: 30 | 30 days supply | Qty: 30 | Fill #0

## 2020-04-22 MED FILL — LEVOTHYROXINE SODIUM 150 MC: 150 | 30 days supply | Qty: 30 | Fill #0

## 2020-04-22 NOTE — Patient Instructions (Signed)
Hipertensión en los adultos °Hypertension, Adult °El término hipertensión es otra forma de denominar a la presión arterial elevada. La presión arterial elevada fuerza al corazón a trabajar más para bombear la sangre. Esto puede causar problemas con el paso del tiempo. °Una lectura de presión arterial está compuesta por 2 números. Hay un número superior (sistólico) sobre un número inferior (diastólico). Lo ideal es tener la presión arterial por debajo de 120/80. Las elecciones saludables pueden ayudar a bajar la presión arterial, o tal vez necesite medicamentos para bajarla. °¿Cuáles son las causas? °Se desconoce la causa de esta afección. Algunas afecciones pueden estar relacionadas con la presión arterial alta. °¿Qué incrementa el riesgo? °· Fumar. °· Tener diabetes mellitus tipo 2, colesterol alto, o ambos. °· No hacer la cantidad suficiente de actividad física o ejercicio. °· Tener sobrepeso. °· Consumir mucha grasa, azúcar, calorías o sal (sodio) en su dieta. °· Beber alcohol en exceso. °· Tener una enfermedad renal a largo plazo (crónica). °· Tener antecedentes familiares de presión arterial alta. °· Edad. Los riesgos aumentan con la edad. °· Raza. El riesgo es mayor para las personas afroamericanas. °· Sexo. Antes de los 45 años, los hombres corren más riesgo que las mujeres. Después de los 65 años, las mujeres corren más riesgo que los hombres. °· Tener apnea obstructiva del sueño. °· Estrés. °¿Cuáles son los signos o los síntomas? °· Es posible que la presión arterial alta puede no cause síntomas. La presión arterial muy alta (crisis hipertensiva) puede provocar: °? Dolor de cabeza. °? Sensaciones de preocupación o nerviosismo (ansiedad). °? Falta de aire. °? Hemorragia nasal. °? Sensación de malestar en el estómago (náuseas). °? Vómitos. °? Cambios en la forma de ver. °? Dolor muy intenso en el pecho. °? Convulsiones. °¿Cómo se trata? °· Esta afección se trata haciendo cambios saludables en el estilo de  vida, por ejemplo: °? Consumir alimentos saludables. °? Hacer más ejercicio. °? Beber menos alcohol. °· El médico puede recetarle medicamentos si los cambios en el estilo de vida no son suficientes para lograr controlar la presión arterial y si: °? El número de arriba está por encima de 130. °? El número de abajo está por encima de 80. °· Su presión arterial personal ideal puede variar. °Siga estas instrucciones en su casa: °Comida y bebida ° °· Si se lo dicen, siga el plan de alimentación de DASH (Dietary Approaches to Stop Hypertension, Maneras de alimentarse para detener la hipertensión). Para seguir este plan: °? Llene la mitad del plato de cada comida con frutas y verduras. °? Llene un cuarto del plato de cada comida con cereales integrales. Los cereales integrales incluyen pasta integral, arroz integral y pan integral. °? Coma y beba productos lácteos con bajo contenido de grasa, como leche descremada o yogur bajo en grasas. °? Llene un cuarto del plato de cada comida con proteínas bajas en grasa (magras). Las proteínas bajas en grasa incluyen pescado, pollo sin piel, huevos, frijoles y tofu. °? Evite consumir carne grasa, carne curada y procesada, o pollo con piel. °? Evite consumir alimentos prehechos o procesados. °· Consuma menos de 1500 mg de sal por día. °· No beba alcohol si: °? El médico le indica que no lo haga. °? Está embarazada, puede estar embarazada o está tratando de quedar embarazada. °· Si bebe alcohol: °? Limite la cantidad que bebe a lo siguiente: °§ De 0 a 1 medida por día para las mujeres. °§ De 0 a 2 medidas por día para los hombres. °? Esté atento a   la cantidad de alcohol que hay en las bebidas que toma. En los [place], una medida equivale a una botella de cerveza de 12 oz (355 ml), un vaso de vino de 5 oz (148 ml) o un vaso de una bebida alcohólica de alta graduación de 1½ oz (44 ml). °Estilo de vida ° °· Trabaje con su médico para mantenerse en un peso saludable o para perder  peso. Pregúntele a su médico cuál es el peso recomendable para usted. °· Haga al menos 30 minutos de ejercicio la mayoría de los días de la semana. Estos pueden incluir caminar, nadar o andar en bicicleta. °· Realice al menos 30 minutos de ejercicio que fortalezca sus músculos (ejercicios de resistencia) al menos 3 días a la semana. Estos pueden incluir levantar pesas o hacer [place]. °· No consuma ningún producto que contenga nicotina o tabaco, como cigarrillos, cigarrillos electrónicos y tabaco de mascar. Si necesita ayuda para dejar de fumar, consulte al médico. °· Controle su presión arterial en su casa tal como le indicó el médico. °· Concurra a todas las visitas de seguimiento como se lo haya indicado el médico. Esto es importante. °Medicamentos °· Tome los medicamentos de venta libre y los recetados solamente como se lo haya indicado el médico. Siga cuidadosamente las indicaciones. °· No omita las dosis de medicamentos para la presión arterial. Los medicamentos pierden eficacia si omite dosis. El hecho de omitir las dosis también aumenta el riesgo de otros problemas. °· Pregúntele a su médico a qué efectos secundarios o reacciones a los medicamentos debe prestar atención. °Comuníquese con un médico si: °· Piensa que tiene una reacción a los medicamentos que está tomando. °· Tiene dolores de cabeza frecuentes (recurrentes). °· Se siente mareado. °· Tiene hinchazón en los tobillos. °· Tiene problemas de visión. °Solicite ayuda inmediatamente si: °· Siente un dolor de cabeza muy intenso. °· Empieza a sentirse desorientado (confundido). °· Se siente débil o adormecido. °· Siente que va a desmayarse. °· Tiene un dolor muy intenso en las siguientes zonas: °? Pecho. °? Vientre (abdomen). °· Vomita más de una vez. °· Tiene dificultad para respirar. °Resumen °· El término hipertensión es otra forma de denominar a la presión arterial elevada. °· La presión arterial elevada fuerza al corazón a trabajar más para bombear  la sangre. °· Para la mayoría de las personas, una presión arterial normal es menor que 120/80. °· Las decisiones saludables pueden ayudarle a disminuir su presión arterial. Si no puede bajar su presión arterial mediante decisiones saludables, es posible que deba tomar medicamentos. °Esta información no tiene como fin reemplazar el consejo del médico. Asegúrese de hacerle al médico cualquier pregunta que tenga. °Document Revised: 03/28/2018 Document Reviewed: 03/28/2018 °Elsevier Patient Education © 2020 Elsevier Inc. ° °

## 2020-04-22 NOTE — Progress Notes (Signed)
Established Patient Office Visit  Subjective:  Patient ID: Caroline Oneal, female    DOB: 04/24/1961  Age: 58 y.o. MRN: [id number]  Due to language barrier, video interpretation system used at today's visit  CC:  Chief Complaint  Patient presents with  . Follow-up    HPI Caroline Oneal, 59 yo female who is a patient of Dr. Henriette Oneal who is seen in follow-up of chronic medical issues including HTN, prediabetes and hypothyroidism.  She reports that she is currently out of lisinopril for treatment of hypertension.  She denies headaches or dizziness related to her blood pressure and denies any current issues with cough associated with ACE inhibitor use.  She does still have chlorthalidone.  She denies any issues with increased thirst or frequent urination related to her prior diagnosis of prediabetes.  She has some mild fatigue but denies any peripheral edema associated with hypothyroidism and is taking her medication daily but does need a refill of her thyroid medication.  Overall she feels well at today's visit.  Past Medical History:  Diagnosis Date  . HTN (hypertension) 10/13/2011  . Thyroid disease Dx 2016  . Tubal ligation status 10/13/2011  . Tubal ligation status 10/13/2011    Past Surgical History:  Procedure Laterality Date  . TUBAL LIGATION       Social History   Socioeconomic History  . Marital status: Single    Spouse name: Not on file  . Number of children: Not on file  . Years of education: Not on file  . Highest education level: Not on file  Occupational History  . Not on file  Tobacco Use  . Smoking status: Never Smoker  . Smokeless tobacco: Never Used  Vaping Use  . Vaping Use: Never used  Substance and Sexual Activity  . Alcohol use: No  . Drug use: No  . Sexual activity: Yes    Partners: Male    Birth control/protection: Surgical    Comment: tubal  Other Topics Concern  . Not on file  Social History Narrative  . Not on file    Social Determinants of Health   Financial Resource Strain:   . Difficulty of Paying Living Expenses: Not on file  Food Insecurity:   . Worried About Programme researcher, broadcasting/film/video in the Last Year: Not on file  . Ran Out of Food in the Last Year: Not on file  Transportation Needs:   . Lack of Transportation (Medical): Not on file  . Lack of Transportation (Non-Medical): Not on file  Physical Activity:   . Days of Exercise per Week: Not on file  . Minutes of Exercise per Session: Not on file  Stress:   . Feeling of Stress : Not on file  Social Connections:   . Frequency of Communication with Friends and Family: Not on file  . Frequency of Social Gatherings with Friends and Family: Not on file  . Attends Religious Services: Not on file  . Active Member of Clubs or Organizations: Not on file  . Attends Banker Meetings: Not on file  . Marital Status: Not on file  Intimate Partner Violence:   . Fear of Current or Ex-Partner: Not on file  . Emotionally Abused: Not on file  . Physically Abused: Not on file  . Sexually Abused: Not on file    Outpatient Medications Prior to Visit  Medication Sig Dispense Refill  . levothyroxine (SYNTHROID) 150 MCG tablet Take 1 tablet (150 mcg total) by mouth daily  before breakfast. 30 tablet 3  . lisinopril (ZESTRIL) 30 MG tablet Take 1 tablet (30 mg total) by mouth daily. To lower blood pressure 30 tablet 6  . Multiple Vitamin (MULTIVITAMIN WITH MINERALS) TABS tablet Take 1 tablet by mouth daily.     No facility-administered medications prior to visit.    Allergies  Allergen Reactions  . Hctz [Hydrochlorothiazide] Other (See Comments)    Chest pain   . Norvasc [Amlodipine Besylate]     CP and dizziness    ROS Review of Systems  Constitutional: Positive for fatigue (mild). Negative for chills and fever.  HENT: Negative for sore throat and trouble swallowing.   Eyes: Negative for photophobia and visual disturbance.  Respiratory:  Negative for cough and shortness of breath.   Cardiovascular: Negative for chest pain and palpitations.  Gastrointestinal: Negative for abdominal pain, constipation, diarrhea and nausea.  Endocrine: Negative for polydipsia, polyphagia and polyuria.  Genitourinary: Negative for dysuria and frequency.  Musculoskeletal: Positive for arthralgias (elbow pain). Negative for back pain.  Skin: Negative for rash and wound.  Neurological: Negative for dizziness and light-headedness.  Hematological: Negative for adenopathy. Does not bruise/bleed easily.  Psychiatric/Behavioral: Negative for suicidal ideas. The patient is not nervous/anxious.       Objective:    Physical Exam Constitutional:      Appearance: Normal appearance.     Comments: Overweight for height  Neck:     Vascular: No carotid bruit.     Comments: No thyromegaly on exam Cardiovascular:     Rate and Rhythm: Normal rate and regular rhythm.  Pulmonary:     Effort: Pulmonary effort is normal.     Breath sounds: Normal breath sounds.  Abdominal:     Palpations: Abdomen is soft.     Tenderness: There is no abdominal tenderness. There is no right CVA tenderness, left CVA tenderness, guarding or rebound.  Musculoskeletal:     Cervical back: Normal range of motion and neck supple. No rigidity or tenderness.     Right lower leg: No edema.     Left lower leg: No edema.  Lymphadenopathy:     Cervical: No cervical adenopathy.  Skin:    General: Skin is warm and dry.  Neurological:     General: No focal deficit present.     Mental Status: She is alert and oriented to person, place, and time.  Psychiatric:        Mood and Affect: Mood normal.        Behavior: Behavior normal.     BP (!) 173/85 (BP Location: Left Arm, Patient Position: Sitting)   Pulse 60   Temp (!) 97 F (36.1 C)   Ht 5' 0.43" (1.535 m)   Wt 180 lb (81.6 kg)   SpO2 95%   BMI 34.65 kg/m  Wt Readings from Last 3 Encounters:  04/22/20 180 lb (81.6 kg)   10/30/19 175 lb 3.2 oz (79.5 kg)  06/12/19 180 lb 6.4 oz (81.8 kg)     Health Maintenance Due  Topic Date Due  . COVID-19 Vaccine (1) Never done  . COLONOSCOPY  Never done  . MAMMOGRAM  02/16/2017   Patient was offered and agreed to have influenza immunization at today's visit.  Lab Results  Component Value Date   TSH 39.600 (H) 06/12/2019   Lab Results  Component Value Date   WBC 6.8 06/12/2019   HGB 14.4 06/12/2019   HCT 43.4 06/12/2019   MCV 92 06/12/2019   PLT 308 06/12/2019  Lab Results  Component Value Date   NA 142 06/12/2019   K 4.2 06/12/2019   CO2 23 06/12/2019   GLUCOSE 101 (H) 06/12/2019   BUN 10 06/12/2019   CREATININE 0.75 06/12/2019   BILITOT 0.5 06/12/2019   ALKPHOS 87 06/12/2019   AST 20 06/12/2019   ALT 20 06/12/2019   PROT 6.8 06/12/2019   ALBUMIN 4.3 06/12/2019   CALCIUM 9.9 06/12/2019   Lab Results  Component Value Date   CHOL 179 06/12/2019   Lab Results  Component Value Date   HDL 46 06/12/2019   Lab Results  Component Value Date   LDLCALC 97 06/12/2019   Lab Results  Component Value Date   TRIG 210 (H) 06/12/2019   Lab Results  Component Value Date   CHOLHDL 3.9 06/12/2019   Lab Results  Component Value Date   HGBA1C 6.1 (H) 06/12/2019      Assessment & Plan:  1. Need for immunization against influenza Patient was offered and agreed to have influenza immunization at today's visit. She was also provided with educational information regarding the immunization.  - Flu Vaccine QUAD 36+ mos IM  2. Essential hypertension She is provided with a refill of her lisinopril 30 mg daily for continued treatment of hypertension.  Information on hypertension in Spanish provided as part of after visit summary.  She has been asked to follow-up with the clinical pharmacist for blood pressure recheck in a few weeks.  She will have basic metabolic panel at today's visit and follow-up of her use of ACE inhibitor's which can cause  hyperkalemia.  Continue use of chlorthalidone and electrolytes and renal function will be checked as part of basic metabolic panel in follow-up of use of diuretic medication. - Basic Metabolic Panel - lisinopril (ZESTRIL) 30 MG tablet; Take 1 tablet (30 mg total) by mouth daily. To lower blood pressure  Dispense: 30 tablet; Refill: 3  3. Prediabetes Patient's Hgb A1c was 6.1 on 06/12/2019.  Patient has not been on any medication such as Metformin and does not wish to start medication at this time.  Hemoglobin A1c at today's visit was again 6.1 and she was asked to consider medication to help with insulin resistance in addition to low carbohydrate diet and regular exercise. - HgB A1c - Basic Metabolic Panel  4. Hypothyroidism, unspecified type She will have TSH at today's visit and follow-up of hypothyroidism and refill provided of levothyroxine 150 mcg.  She will be notified if a change is needed in the dose of her thyroid medication based on today's blood work. - TSH - levothyroxine (SYNTHROID) 150 MCG tablet; Take 1 tablet (150 mcg total) by mouth daily before breakfast.  Dispense: 30 tablet; Refill: 3  5. Language barrier Video interpretation system used at today's visit to help with language barrier    Follow-up: Return in about 6 weeks (around 06/03/2020) for HTN- appointment in 2-3 weeks with Luke/CPP and 6-7 week f/u with PCP/Johnson.   Cain Saupe, MD

## 2020-04-28 ENCOUNTER — Telehealth: Payer: Self-pay | Admitting: Internal Medicine

## 2020-04-28 NOTE — Telephone Encounter (Signed)
Pt inquiring about test results. Please advise and thank you

## 2020-04-29 NOTE — Telephone Encounter (Signed)
Contacted pt via [employer] ([place], [phone]), all questions answered, pt aware of all upcoming appts and need to have labs drawn

## 2020-04-30 ENCOUNTER — Ambulatory Visit: Payer: Self-pay | Attending: Internal Medicine

## 2020-04-30 ENCOUNTER — Other Ambulatory Visit: Payer: Self-pay

## 2020-05-06 ENCOUNTER — Encounter: Payer: Self-pay | Admitting: Pharmacist

## 2020-05-06 ENCOUNTER — Other Ambulatory Visit: Payer: Self-pay

## 2020-05-06 ENCOUNTER — Other Ambulatory Visit: Payer: Self-pay | Admitting: Internal Medicine

## 2020-05-06 ENCOUNTER — Ambulatory Visit: Payer: Self-pay | Attending: Internal Medicine | Admitting: Pharmacist

## 2020-05-06 VITALS — BP 168/97 | HR 55

## 2020-05-06 DIAGNOSIS — I10 Essential (primary) hypertension: Secondary | ICD-10-CM

## 2020-05-06 MED ORDER — CHLORTHALIDONE 25 MG PO TABS: 12.5000 mg | ORAL_TABLET | Freq: Every day | ORAL | 0 refills | Status: DC

## 2020-05-06 NOTE — Progress Notes (Signed)
   S:    Patient arrives in good spirits.  Presents to the clinic for hypertension evaluation, counseling, and management. Patient was referred on 04/22/2020 by Dr. Jillyn Hidden. Pt last saw his PCP 10/14/2019.   Medication adherence reported.  Current BP Medications include:  Lisinopril 30 mg daily   Antihypertensives tried in the past include: HCTZ, amlodipine - both caused chest pain  Dietary habits include: reports compliance with salt restriction; drinks coffee daily but denies drinking excessive amounts (not  >3 cups daily) Exercise habits include: none  Family / Social history:  - FHx: no family hx on file  - Tobacco: never smoker  - Alcohol: denies use   O:  Vitals:   05/06/20 1457  BP: (!) 168/97  Pulse: (!) 55   Home BP readings: none   Last 3 Office BP readings: BP Readings from Last 3 Encounters:  05/06/20 (!) 168/97  04/22/20 (!) 180/93  10/30/19 (!) 186/94    BMET    Component Value Date/Time   NA 142 06/12/2019 1002   K 4.2 06/12/2019 1002   CL 106 06/12/2019 1002   CO2 23 06/12/2019 1002   GLUCOSE 101 (H) 06/12/2019 1002   GLUCOSE 105 (H) 03/03/2016 1206   BUN 10 06/12/2019 1002   CREATININE 0.75 06/12/2019 1002   CREATININE 0.66 03/03/2016 1206   CALCIUM 9.9 06/12/2019 1002   GFRNONAA 88 06/12/2019 1002   GFRNONAA >89 03/03/2016 1206   GFRAA 101 06/12/2019 1002   GFRAA >89 03/03/2016 1206    Renal function: CrCl cannot be calculated (Patient's most recent lab result is older than the maximum 21 days allowed.).  Clinical ASCVD: No  The 10-year ASCVD risk score Denman George DC Jr., et al., 2013) is: 7.8%   Values used to calculate the score:     Age: 5 years     Sex: Female     Is Non-Hispanic African American: No     Diabetic: No     Tobacco smoker: No     Systolic Blood Pressure: 168 mmHg     Is BP treated: Yes     HDL Cholesterol: 46 mg/dL     Total Cholesterol: 179 mg/dL   A/P: Hypertension longstanding currently uncontrolled on current  medications. BP Goal = < 130/80 mmHg. Medication adherence reported. She has tried and had intolerances to amlodipine and HCTZ. Will add chlorthalidone and see if she tolerates 12.5 mg daily.  -Continued lisinopril 30 mg daily. -Start chlorthalidone 12.5 mg daily.  -Counseled on lifestyle modifications for blood pressure control including reduced dietary sodium, increased exercise, adequate sleep.  Results reviewed and written information provided.   Total time in face-to-face counseling 30 minutes.   F/U Clinic Visit in 2 weeks.   Butch Penny, PharmD, CPP Clinical Pharmacist [facility] [phone]

## 2020-05-06 NOTE — Patient Instructions (Signed)
Gracias por venir a vernos hoy.  La presin arterial hoy est elevada.  Contine con lisinopril. Tomar 1 tableta al da. Empiece a disfrutar de la noche.  Comience con clortalidona. Tomar 1/2 tableta por la maana.  Limitar la sal y la cafena, as como hacer ejercicio durante al menos 30 minutos durante 5 das a la [place], tambin puede ayudarlo a Publishing copy la presin arterial.  Oswaldo Done su presin arterial en casa si puede. Anote estos nmeros y trigalos a sus visitas.  Si tiene [employer], llmeme al [phone].  Haz un seguimiento conmigo en 2 semanas.       English:  Thank you for coming to see [place] today.   Blood pressure today is elevated.  Continue lisinopril. Take 1 tablet daily. Start taking in the evening.   Start chlorthalidone. Take 1/2 tablet in the morning.   Limiting salt and caffeine, as well as exercising as able for at least 30 minutes for 5 days out of the week, can also help you lower your blood pressure.  Take your blood pressure at home if you are able. Please write down these numbers and bring them to your visits.  If you have any questions about medications, please call me [phone].  Follow-up with me in 2 weeks.

## 2020-05-07 MED FILL — CHLORTHALIDONE 25 MG TAB: 25 | 30 days supply | Qty: 15 | Fill #0

## 2020-05-24 ENCOUNTER — Other Ambulatory Visit: Payer: Self-pay | Admitting: Internal Medicine

## 2020-05-24 ENCOUNTER — Other Ambulatory Visit: Payer: Self-pay

## 2020-05-24 ENCOUNTER — Ambulatory Visit: Payer: Self-pay | Attending: Internal Medicine | Admitting: Pharmacist

## 2020-05-24 ENCOUNTER — Encounter: Payer: Self-pay | Admitting: Pharmacist

## 2020-05-24 DIAGNOSIS — I10 Essential (primary) hypertension: Secondary | ICD-10-CM

## 2020-05-24 MED ORDER — CHLORTHALIDONE 25 MG PO TABS: 25.0000 mg | ORAL_TABLET | Freq: Every day | ORAL | 2 refills | Status: DC

## 2020-05-24 NOTE — Progress Notes (Signed)
   S:    Patient arrives in good spirits.  Presents to the clinic for hypertension evaluation, counseling, and management. Patient was referred on 04/22/2020 by Dr. Jillyn Hidden. I saw her on 05/06/2020 and started chlorthalidone.    Medication adherence reported but patient has been taking differently than prescribed.   Current BP Medications include:  Lisinopril 30 mg daily, chlorthalidone 12.5 mg (been taking 25 mg daily)   Antihypertensives tried in the past include: HCTZ, amlodipine - both caused chest pain  Dietary habits include: reports compliance with salt restriction; drinks coffee daily but denies drinking excessive amounts (not  >3 cups daily) Exercise habits include: none  Family / Social history:  - FHx: no family hx on file  - Tobacco: never smoker  - Alcohol: denies use   O:  Vitals:   05/24/20 1452  BP: (!) 144/84   Home BP readings: none   Last 3 Office BP readings: BP Readings from Last 3 Encounters:  05/24/20 (!) 144/84  05/06/20 (!) 168/97  04/22/20 (!) 180/93    BMET    Component Value Date/Time   NA 142 06/12/2019 1002   K 4.2 06/12/2019 1002   CL 106 06/12/2019 1002   CO2 23 06/12/2019 1002   GLUCOSE 101 (H) 06/12/2019 1002   GLUCOSE 105 (H) 03/03/2016 1206   BUN 10 06/12/2019 1002   CREATININE 0.75 06/12/2019 1002   CREATININE 0.66 03/03/2016 1206   CALCIUM 9.9 06/12/2019 1002   GFRNONAA 88 06/12/2019 1002   GFRNONAA >89 03/03/2016 1206   GFRAA 101 06/12/2019 1002   GFRAA >89 03/03/2016 1206    Renal function: CrCl cannot be calculated (Patient's most recent lab result is older than the maximum 21 days allowed.).  Clinical ASCVD: No  The 10-year ASCVD risk score Denman George DC Jr., et al., 2013) is: 5.8%   Values used to calculate the score:     Age: 1 years     Sex: Female     Is Non-Hispanic African American: No     Diabetic: No     Tobacco smoker: No     Systolic Blood Pressure: 144 mmHg     Is BP treated: Yes     HDL Cholesterol: 46  mg/dL     Total Cholesterol: 179 mg/dL   A/P: Hypertension longstanding currently uncontrolled but improved on current medications. BP Goal = < 130/80 mmHg. Medication adherence reported. -Continued lisinopril 30 mg daily. -Continued chlorthalidone 25 mg daily.  -Counseled on lifestyle modifications for blood pressure control including reduced dietary sodium, increased exercise, adequate sleep.  Results reviewed and written information provided.   Total time in face-to-face counseling 30 minutes.   F/U Clinic Visit with PCP 06/17/2020.   Butch Penny, PharmD, CPP Clinical Pharmacist [facility] [phone]

## 2020-05-25 LAB — BASIC METABOLIC PANEL
BUN/Creatinine Ratio: 31 — ABNORMAL HIGH (ref 12–28)
BUN: 23 mg/dL (ref 8–27)
CO2: 24 mmol/L (ref 20–29)
Calcium: 10.1 mg/dL (ref 8.7–10.3)
Chloride: 99 mmol/L (ref 96–106)
Creatinine, Ser: 0.75 mg/dL (ref 0.57–1.00)
GFR calc Af Amer: 100 mL/min/{1.73_m2} (ref >59)
GFR calc non Af Amer: 87 mL/min/{1.73_m2} (ref >59)
Glucose: 99 mg/dL (ref 65–99)
Potassium: 4.1 mmol/L (ref 3.5–5.2)
Sodium: 139 mmol/L (ref 134–144)

## 2020-05-26 ENCOUNTER — Telehealth: Payer: Self-pay

## 2020-05-26 NOTE — Telephone Encounter (Signed)
[employer] interpreters Gabriel Rainwater  [phone] contacted pt to go over lab results pt didn't answer was unable to lvm due to vm not being set up

## 2020-05-26 NOTE — Progress Notes (Signed)
Potassium level and kidney function are normal.

## 2020-05-27 MED FILL — ?CHLORTHALIDONE 25 MG TABLE: 25 | 30 days supply | Qty: 30 | Fill #0

## 2020-06-17 ENCOUNTER — Ambulatory Visit: Payer: Self-pay | Admitting: Internal Medicine

## 2020-06-30 MED FILL — LISINOPRIL 30 MG TABS: 30 | 30 days supply | Qty: 30 | Fill #1

## 2020-06-30 MED FILL — ?CHLORTHALIDONE 25 MG TABLE: 25 | 30 days supply | Qty: 30 | Fill #1

## 2020-06-30 MED FILL — LEVOTHYROXINE SODIUM 150 MC: 150 | 30 days supply | Qty: 30 | Fill #1

## 2020-07-22 ENCOUNTER — Ambulatory Visit: Payer: Self-pay | Attending: Internal Medicine | Admitting: Physician Assistant

## 2020-07-22 ENCOUNTER — Other Ambulatory Visit: Payer: Self-pay | Admitting: Physician Assistant

## 2020-07-22 ENCOUNTER — Other Ambulatory Visit: Payer: Self-pay

## 2020-07-22 ENCOUNTER — Encounter: Payer: Self-pay | Admitting: Physician Assistant

## 2020-07-22 VITALS — BP 124/72 | HR 68 | Ht 60.0 in | Wt 180.8 lb

## 2020-07-22 DIAGNOSIS — Z789 Other specified health status: Secondary | ICD-10-CM

## 2020-07-22 DIAGNOSIS — E039 Hypothyroidism, unspecified: Secondary | ICD-10-CM

## 2020-07-22 DIAGNOSIS — I10 Essential (primary) hypertension: Secondary | ICD-10-CM

## 2020-07-22 MED ORDER — CHLORTHALIDONE 25 MG PO TABS: 25.0000 mg | ORAL_TABLET | Freq: Every day | ORAL | 2 refills | Status: DC

## 2020-07-22 MED ORDER — LISINOPRIL 30 MG PO TABS: 30.0000 mg | ORAL_TABLET | Freq: Every day | ORAL | 3 refills | Status: DC

## 2020-07-22 MED ORDER — LEVOTHYROXINE SODIUM 150 MCG PO TABS: 150.0000 ug | ORAL_TABLET | Freq: Every day | ORAL | 3 refills | Status: DC

## 2020-07-22 NOTE — Progress Notes (Signed)
Patient ID: Caroline Oneal, female   DOB: October 11, 1960, 60 y.o.   MRN: [id number]   Caroline Oneal, is a 60 y.o. female  IEP:[id number]  YSA:[id number]  DOB - 09-17-1960  Subjective:  Chief Complaint and HPI: Caroline Oneal is a 60 y.o. female here today for BP check and med RF.  No issues or concerns.  Tolerating meds w/o any problem.  Reports compliance.  No HA/CP/dizziness  ROS:   Constitutional:  No f/c, No night sweats, No unexplained weight loss. EENT:  No vision changes, No blurry vision, No hearing changes. No mouth, throat, or ear problems.  Respiratory: No cough, No SOB Cardiac: No CP, no palpitations GI:  No abd pain, No N/V/D. GU: No Urinary s/sx Musculoskeletal: No joint pain Neuro: No headache, no dizziness, no motor weakness.  Skin: No rash Endocrine:  No polydipsia. No polyuria.  Psych: Denies SI/HI  No problems updated.  ALLERGIES: Allergies  Allergen Reactions  . Hctz [Hydrochlorothiazide] Other (See Comments)    Chest pain   . Norvasc [Amlodipine Besylate]     CP and dizziness    PAST MEDICAL HISTORY: Past Medical History:  Diagnosis Date  . HTN (hypertension) 10/13/2011  . Thyroid disease Dx 2016  . Tubal ligation status 10/13/2011  . Tubal ligation status 10/13/2011    MEDICATIONS AT HOME: Prior to Admission medications   Medication Sig Start Date End Date Taking? Authorizing Provider  Multiple Vitamin (MULTIVITAMIN WITH MINERALS) TABS tablet Take 1 tablet by mouth daily.   Yes [provider]  chlorthalidone (HYGROTON) 25 MG tablet Take 1 tablet (25 mg total) by mouth daily. 07/22/20   Anders Simmonds, PA-C  levothyroxine (SYNTHROID) 150 MCG tablet Take 1 tablet (150 mcg total) by mouth daily before breakfast. 07/22/20   Anders Simmonds, PA-C  lisinopril (ZESTRIL) 30 MG tablet Take 1 tablet (30 mg total) by mouth daily. To lower blood pressure 07/22/20   Anders Simmonds, PA-C     Objective:  EXAM:    Vitals:   07/22/20 1502  BP: 124/72  Pulse: 68  SpO2: 97%  Weight: 180 lb 12.8 oz (82 kg)  Height: 5' (1.524 m)    General appearance : A&OX3. NAD. Non-toxic-appearing HEENT: Atraumatic and Normocephalic.  PERRLA. EOM intact. Neck: supple, no JVD. No cervical lymphadenopathy. No thyromegaly Chest/Lungs:  Breathing-non-labored, Good air entry bilaterally, breath sounds normal without rales, rhonchi, or wheezing  CVS: S1 S2 regular, no murmurs, gallops, rubs  Extremities: Bilateral Lower Ext shows no edema, both legs are warm to touch with = pulse throughout Neurology:  CN II-XII grossly intact, Non focal.   Psych:  TP linear. J/I WNL. Normal speech. Appropriate eye contact and affect.  Skin:  No Rash  Data Review Lab Results  Component Value Date   HGBA1C 6.1 (A) 04/22/2020   HGBA1C 6.1 (H) 06/12/2019   HGBA1C 6.1 12/14/2016     Assessment & Plan   1. Essential hypertension Controlled-continue current regimen - chlorthalidone (HYGROTON) 25 MG tablet; Take 1 tablet (25 mg total) by mouth daily.  Dispense: 30 tablet; Refill: 2 - lisinopril (ZESTRIL) 30 MG tablet; Take 1 tablet (30 mg total) by mouth daily. To lower blood pressure  Dispense: 30 tablet; Refill: 3 - Basic metabolic panel  2. Language barrier [employer] interpreters used and additional time performing visit was required.   3. Hypothyroidism, unspecified type - Thyroid Panel With TSH - levothyroxine (SYNTHROID) 150 MCG tablet; Take 1 tablet (150 mcg total) by mouth  daily before breakfast.  Dispense: 30 tablet; Refill: 3 - Basic metabolic panel   Patient have been counseled extensively about nutrition and exercise  Return in about 4 months (around 11/19/2020) for PCP;  chronic conditions.  The patient was given clear instructions to go to ER or return to medical center if symptoms don't improve, worsen or new problems develop. The patient verbalized understanding. The patient was told to call to get lab  results if they haven't heard anything in the next week.     Georgian Co, PA-C [facility] [place], [place] [phone]   07/22/2020, 3:09 PM

## 2020-07-23 LAB — BASIC METABOLIC PANEL
BUN/Creatinine Ratio: 21 (ref 12–28)
BUN: 19 mg/dL (ref 8–27)
CO2: 22 mmol/L (ref 20–29)
Calcium: 9.9 mg/dL (ref 8.7–10.3)
Chloride: 101 mmol/L (ref 96–106)
Creatinine, Ser: 0.91 mg/dL (ref 0.57–1.00)
GFR calc Af Amer: 79 mL/min/{1.73_m2} (ref >59)
GFR calc non Af Amer: 69 mL/min/{1.73_m2} (ref >59)
Glucose: 141 mg/dL — ABNORMAL HIGH (ref 65–99)
Potassium: 3.5 mmol/L (ref 3.5–5.2)
Sodium: 141 mmol/L (ref 134–144)

## 2020-07-23 LAB — THYROID PANEL WITH TSH
Free Thyroxine Index: 2.2 (ref 1.2–4.9)
T3 Uptake Ratio: 29 % (ref 24–39)
T4, Total: 7.5 ug/dL (ref 4.5–12.0)
TSH: 14.7 u[IU]/mL — ABNORMAL HIGH (ref 0.450–4.500)

## 2020-07-28 ENCOUNTER — Other Ambulatory Visit: Payer: Self-pay | Admitting: Physician Assistant

## 2020-07-28 DIAGNOSIS — E039 Hypothyroidism, unspecified: Secondary | ICD-10-CM

## 2020-07-28 MED ORDER — LEVOTHYROXINE SODIUM 175 MCG PO TABS: 175.0000 ug | ORAL_TABLET | Freq: Every day | ORAL | 3 refills | Status: DC

## 2020-07-28 MED FILL — LEVOTHYROXINE SODIUM 175 MC: 175 | 30 days supply | Qty: 30 | Fill #0

## 2020-08-02 ENCOUNTER — Encounter: Payer: Self-pay | Admitting: *Deleted

## 2020-08-10 MED FILL — LEVOTHYROXINE SODIUM 150 MC: 150 | 30 days supply | Qty: 30 | Fill #2

## 2020-08-10 MED FILL — ?CHLORTHALIDONE 25 MG TABLE: 25 | 30 days supply | Qty: 30 | Fill #2

## 2020-08-10 MED FILL — LISINOPRIL 30 MG TABS: 30 | 30 days supply | Qty: 30 | Fill #2

## 2020-09-16 MED FILL — ?CHLORTHALIDONE 25 MG TABLE: 25 | 30 days supply | Qty: 30 | Fill #0

## 2020-09-16 MED FILL — LISINOPRIL 30 MG TABS: 30 | 30 days supply | Qty: 30 | Fill #3

## 2020-09-16 MED FILL — LEVOTHYROXINE SODIUM 150 MC: 150 | 30 days supply | Qty: 30 | Fill #3

## 2020-09-25 ENCOUNTER — Other Ambulatory Visit: Payer: Self-pay

## 2020-10-15 ENCOUNTER — Encounter (HOSPITAL_COMMUNITY): Payer: Self-pay

## 2020-10-15 ENCOUNTER — Other Ambulatory Visit: Payer: Self-pay

## 2020-10-15 ENCOUNTER — Ambulatory Visit (INDEPENDENT_AMBULATORY_CARE_PROVIDER_SITE_OTHER): Payer: Self-pay

## 2020-10-15 ENCOUNTER — Ambulatory Visit (HOSPITAL_COMMUNITY)
Admission: EM | Admit: 2020-10-15 | Discharge: 2020-10-15 | Disposition: A | Discharge status: Home / Self Care | Payer: Self-pay | Attending: Physician Assistant | Admitting: Physician Assistant

## 2020-10-15 DIAGNOSIS — R1084 Generalized abdominal pain: Secondary | ICD-10-CM

## 2020-10-15 DIAGNOSIS — R109 Unspecified abdominal pain: Secondary | ICD-10-CM

## 2020-10-15 LAB — POCT URINALYSIS DIPSTICK, ED / UC
Bilirubin Urine: NEGATIVE
Glucose, UA: NEGATIVE mg/dL
Hgb urine dipstick: NEGATIVE
Ketones, ur: NEGATIVE mg/dL
Leukocytes,Ua: NEGATIVE
Nitrite: NEGATIVE
Protein, ur: NEGATIVE mg/dL
Specific Gravity, Urine: 1.015 (ref 1.005–1.030)
Urobilinogen, UA: 0.2 mg/dL (ref 0.0–1.0)
pH: 5 (ref 5.0–8.0)

## 2020-10-15 LAB — CBC WITH DIFFERENTIAL/PLATELET
Abs Immature Granulocytes: 0.04 10*3/uL (ref 0.00–0.07)
Basophils Absolute: 0.1 10*3/uL (ref 0.0–0.1)
Basophils Relative: 1 %
Eosinophils Absolute: 0.5 10*3/uL (ref 0.0–0.5)
Eosinophils Relative: 5 %
HCT: 46.3 % — ABNORMAL HIGH (ref 36.0–46.0)
Hemoglobin: 15.9 g/dL — ABNORMAL HIGH (ref 12.0–15.0)
Immature Granulocytes: 1 %
Lymphocytes Relative: 32 %
Lymphs Abs: 2.7 10*3/uL (ref 0.7–4.0)
MCH: 31.7 pg (ref 26.0–34.0)
MCHC: 34.3 g/dL (ref 30.0–36.0)
MCV: 92.2 fL (ref 80.0–100.0)
Monocytes Absolute: 0.9 10*3/uL (ref 0.1–1.0)
Monocytes Relative: 11 %
Neutro Abs: 4.4 10*3/uL (ref 1.7–7.7)
Neutrophils Relative %: 50 %
Platelets: 341 10*3/uL (ref 150–400)
RBC: 5.02 MIL/uL (ref 3.87–5.11)
RDW: 12.7 % (ref 11.5–15.5)
WBC: 8.6 10*3/uL (ref 4.0–10.5)
nRBC: 0 % (ref 0.0–0.2)

## 2020-10-15 LAB — COMPREHENSIVE METABOLIC PANEL
ALT: 23 U/L (ref 0–44)
AST: 26 U/L (ref 15–41)
Albumin: 4.4 g/dL (ref 3.5–5.0)
Alkaline Phosphatase: 75 U/L (ref 38–126)
Anion gap: 9 (ref 5–15)
BUN: 15 mg/dL (ref 8–23)
CO2: 26 mmol/L (ref 22–32)
Calcium: 10 mg/dL (ref 8.9–10.3)
Chloride: 102 mmol/L (ref 98–111)
Creatinine, Ser: 0.8 mg/dL (ref 0.44–1.00)
GFR, Estimated: >60 mL/min (ref >60)
Glucose, Bld: 112 mg/dL — ABNORMAL HIGH (ref 70–99)
Potassium: 3.8 mmol/L (ref 3.5–5.1)
Sodium: 137 mmol/L (ref 135–145)
Total Bilirubin: 0.9 mg/dL (ref 0.3–1.2)
Total Protein: 8.1 g/dL (ref 6.5–8.1)

## 2020-10-15 MED ORDER — DICYCLOMINE HCL 20 MG PO TABS
20.0000 mg | ORAL_TABLET | Freq: Two times a day (BID) | ORAL | 0 refills | Status: DC
  Filled 2020-10-15: qty 20, 10d supply, fill #0

## 2020-10-15 NOTE — ED Triage Notes (Addendum)
Pt in with c/o pain above her belly button that has been going on for 2 weeks now  Pt took tylenol last night for sx  Denies any N/V

## 2020-10-15 NOTE — ED Provider Notes (Signed)
[facility]    CSN: [id number] Arrival date & time: 10/15/20  1358      History   Chief Complaint Chief Complaint  Patient presents with  . Abdominal Pain    HPI Caroline Oneal is a 60 y.o. female.   Patient presents today with a 2-week history of centralized abdominal pain.  She is Spanish-speaking and interpreter was utilized during visit.  She reports pain is rated 10 on a 0-10 pain scale, localized to central region with radiation throughout abdomen, described as aching, no aggravating or alleviating factors identified.  She has not tried any over-the-counter medications for symptom management.  Denies associated nausea, vomiting, melena, hematochezia, changes in bowel habits, fever.  She denies any recent medication changes or antibiotic use.  She denies history of gastrointestinal disorder or abdominal surgery outside of tubal ligation.  She denies any urinary or vaginal symptoms.  She denies any recent travel.     Past Medical History:  Diagnosis Date  . HTN (hypertension) 10/13/2011  . Thyroid disease Dx 2016  . Tubal ligation status 10/13/2011  . Tubal ligation status 10/13/2011    Patient Active Problem List   Diagnosis Date Noted  . Hyperlipidemia 11/18/2017  . Cough 12/14/2016  . Poor dentition 08/04/2015  . Allergic rhinitis 01/13/2015  . Obesity 06/03/2014  . Prediabetes 06/03/2014  . Hypothyroidism 06/03/2014  . HTN (hypertension) 10/13/2011    Past Surgical History:  Procedure Laterality Date  . TUBAL LIGATION      OB History   No obstetric history on file.      Home Medications    Prior to Admission medications   Medication Sig Start Date End Date Taking? Authorizing Provider  dicyclomine (BENTYL) 20 MG tablet Take 1 tablet (20 mg total) by mouth 2 (two) times daily. 10/15/20  Yes Patrycja Mumpower K, PA-C  chlorthalidone (HYGROTON) 25 MG tablet TAKE 1 TABLET (25 MG TOTAL) BY MOUTH DAILY. 07/22/20 07/22/21  Anders Simmonds, PA-C   levothyroxine (SYNTHROID) 175 MCG tablet TAKE 1 TABLET (175 MCG TOTAL) BY MOUTH DAILY BEFORE BREAKFAST. STOP TAKING 07/28/20 07/28/21  Anders Simmonds, PA-C  lisinopril (ZESTRIL) 30 MG tablet TAKE 1 TABLET (30 MG TOTAL) BY MOUTH DAILY. TO LOWER BLOOD PRESSURE 07/22/20 07/22/21  Anders Simmonds, PA-C  Multiple Vitamin (MULTIVITAMIN WITH MINERALS) TABS tablet Take 1 tablet by mouth daily.    [provider]    Family History History reviewed. No pertinent family history.  Social History Social History   Tobacco Use  . Smoking status: Never Smoker  . Smokeless tobacco: Never Used  Vaping Use  . Vaping Use: Never used  Substance Use Topics  . Alcohol use: No  . Drug use: No     Allergies   Hctz [hydrochlorothiazide] and Norvasc [amlodipine besylate]   Review of Systems Review of Systems  Constitutional: Negative for activity change, appetite change, fatigue and fever.  Respiratory: Negative for cough and shortness of breath.   Cardiovascular: Negative for chest pain.  Gastrointestinal: Positive for abdominal pain. Negative for blood in stool, constipation, diarrhea, nausea and vomiting.  Musculoskeletal: Negative for arthralgias and myalgias.  Neurological: Positive for headaches. Negative for dizziness and light-headedness.     Physical Exam Triage Vital Signs ED Triage Vitals  Enc Vitals Group     BP 10/15/20 1425 (!) 154/91     Pulse Rate 10/15/20 1424 69     Resp 10/15/20 1424 19     Temp 10/15/20 1424 98.2  F (36.8 C)     Temp src --      SpO2 10/15/20 1424 96 %     Weight --      Height --      Head Circumference --      Peak Flow --      Pain Score 10/15/20 1420 7     Pain Loc --      Pain Edu? --      Excl. in GC? --    No data found.  Updated Vital Signs BP (!) 154/91   Pulse 69   Temp 98.2 F (36.8 C)   Resp 19   SpO2 96%   Visual Acuity Right Eye Distance:   Left Eye Distance:   Bilateral Distance:    Right Eye Near:    Left Eye Near:    Bilateral Near:     Physical Exam Vitals reviewed.  Constitutional:      General: She is awake. She is not in acute distress.    Appearance: Normal appearance. She is not ill-appearing.     Comments: Very pleasant female appears stated age in no acute distress  HENT:     Head: Normocephalic and atraumatic.  Cardiovascular:     Rate and Rhythm: Normal rate and regular rhythm.     Heart sounds: No murmur heard.   Pulmonary:     Effort: Pulmonary effort is normal.     Breath sounds: Normal breath sounds. No wheezing, rhonchi or rales.     Comments: Clear to auscultation bilaterally Abdominal:     General: Bowel sounds are normal.     Palpations: Abdomen is soft.     Tenderness: There is generalized abdominal tenderness. There is no right CVA tenderness, left CVA tenderness, guarding or rebound.     Comments: Tenderness palpation throughout abdomen worse in midline.  No evidence of acute abdomen on physical exam.  Psychiatric:        Behavior: Behavior is cooperative.      UC Treatments / Results  Labs (all labs ordered are listed, but only abnormal results are displayed) Labs Reviewed  CBC WITH DIFFERENTIAL/PLATELET  COMPREHENSIVE METABOLIC PANEL  POCT URINALYSIS DIPSTICK, ED / UC    EKG   Radiology DG Abdomen 1 View  Result Date: 10/15/2020 CLINICAL DATA:  Generalized abdominal pain for 2 weeks. EXAM: ABDOMEN - 1 VIEW COMPARISON:  None. FINDINGS: No bowel dilatation to suggest obstruction. Small to moderate volume of stool in the colon. No visualized radiopaque calculi. No concerning intraabdominal mass effect. Mild scoliosis in the lumbar spine. No acute osseous abnormalities are seen. IMPRESSION: Unremarkable radiograph of the abdomen. Electronically Signed   By: Narda Rutherford M.D.   On: 10/15/2020 16:22    Procedures Procedures (including critical care time)  Medications Ordered in UC Medications - No data to display  Initial Impression /  Assessment and Plan / UC Course  I have reviewed the triage vital signs and the nursing notes.  Pertinent labs & imaging results that were available during my care of the patient were reviewed by me and considered in my medical decision making (see chart for details).     Vitals and the physical exam reassuring today; no indication for emergent evaluation or imaging.  X-ray showed no acute findings.  UA was normal in office.  CBC and CMP drawn today-results pending.  Patient was prescribed dicyclomine to manage abdominal cramping.  Recommended she rest and drink plenty of fluid.  Discussed that we  are unable to obtain an CT and if she has persistent/worsening symptoms she is to go to ER for further evaluation.  Strict return precautions given to which patient expressed understanding.  Final Clinical Impressions(s) / UC Diagnoses   Final diagnoses:  Generalized abdominal pain  Abdominal cramping     Discharge Instructions     Use Bentyl twice daily for cramping and abdominal pain.  Eat a bland diet and drink plenty of fluid.  If you have persistent or worsening symptoms you need to go to the ER.    ED Prescriptions    Medication Sig Dispense Auth. Provider   dicyclomine (BENTYL) 20 MG tablet Take 1 tablet (20 mg total) by mouth 2 (two) times daily. 20 tablet Kenyen Candy, Noberto Retort, PA-C     PDMP not reviewed this encounter.   Jeani Hawking, PA-C 10/15/20 1644

## 2020-10-15 NOTE — Discharge Instructions (Addendum)
Use Bentyl twice daily for cramping and abdominal pain.  Eat a bland diet and drink plenty of fluid.  If you have persistent or worsening symptoms you need to go to the ER.

## 2020-10-18 ENCOUNTER — Other Ambulatory Visit: Payer: Self-pay

## 2020-11-08 ENCOUNTER — Other Ambulatory Visit: Payer: Self-pay

## 2020-11-08 MED FILL — Levothyroxine Sodium Tab 175 MCG: ORAL | 30 days supply | Qty: 30 | Fill #0 | Status: AC

## 2020-11-08 MED FILL — Chlorthalidone Tab 25 MG: ORAL | 30 days supply | Qty: 30 | Fill #0 | Status: AC

## 2020-11-08 MED FILL — Lisinopril Tab 30 MG: ORAL | 30 days supply | Qty: 30 | Fill #0 | Status: AC

## 2020-11-09 ENCOUNTER — Other Ambulatory Visit: Payer: Self-pay

## 2020-11-09 ENCOUNTER — Ambulatory Visit: Payer: Self-pay | Admitting: Physician Assistant

## 2020-11-09 VITALS — BP 156/85 | HR 69 | Temp 98.2°F | Resp 18 | Ht 60.0 in | Wt 183.0 lb

## 2020-11-09 DIAGNOSIS — K219 Gastro-esophageal reflux disease without esophagitis: Secondary | ICD-10-CM

## 2020-11-09 DIAGNOSIS — Z6835 Body mass index (BMI) 35.0-35.9, adult: Secondary | ICD-10-CM

## 2020-11-09 DIAGNOSIS — A048 Other specified bacterial intestinal infections: Secondary | ICD-10-CM

## 2020-11-09 DIAGNOSIS — M6208 Separation of muscle (nontraumatic), other site: Secondary | ICD-10-CM

## 2020-11-09 DIAGNOSIS — R1013 Epigastric pain: Secondary | ICD-10-CM

## 2020-11-09 MED ORDER — PANTOPRAZOLE SODIUM 40 MG PO TBEC
40.0000 mg | DELAYED_RELEASE_TABLET | Freq: Every day | ORAL | 3 refills | Status: DC
  Filled 2020-11-09: qty 30, 30d supply, fill #0

## 2020-11-09 NOTE — Progress Notes (Signed)
Established Patient Office Visit  Subjective:  Patient ID: Caroline Oneal, female    DOB: 1961/02/14  Age: 60 y.o. MRN: [id number]  CC:  Chief Complaint  Patient presents with  . Abdominal Pain    HPI Caroline Oneal reports that she has been having epigastric abdominal pain for the past month, describes it as achy, like a headache.  Reports that she does experience heartburn several times a week.  Reports that she also has been having bloating after most meals, states that this has been present for the past 6 months to a year.  Reports that she has noticed a bulge above her bellybutton, states that it comes and goes, denies discomfort.  Denies any abdominal surgeries other than tubal ligation.  Reports that she was seen at [facility] October 15, 2020 for similar complaint.  Hospital note:    Vitals and the physical exam reassuring today; no indication for emergent evaluation or imaging.  X-ray showed no acute findings.  UA was normal in office.  CBC and CMP drawn today-results pending.  Patient was prescribed dicyclomine to manage abdominal cramping.  Recommended she rest and drink plenty of fluid.  Discussed that we are unable to obtain an CT and if she has persistent/worsening symptoms she is to go to ER for further evaluation.  Strict return precautions given to which patient expressed understanding.  States today that she did try the Bentyl with some relief.  Otherwise she has not tried anything for relief.  Denies NSAID use.  Due to language barrier, an interpreter was present during the history-taking and subsequent discussion (and for part of the physical exam) with this patient.  Past Medical History:  Diagnosis Date  . HTN (hypertension) 10/13/2011  . Thyroid disease Dx 2016  . Tubal ligation status 10/13/2011  . Tubal ligation status 10/13/2011    Past Surgical History:  Procedure Laterality Date  . TUBAL LIGATION      History reviewed. No pertinent family  history.  Social History   Socioeconomic History  . Marital status: Single    Spouse name: Not on file  . Number of children: Not on file  . Years of education: Not on file  . Highest education level: Not on file  Occupational History  . Not on file  Tobacco Use  . Smoking status: Never Smoker  . Smokeless tobacco: Never Used  Vaping Use  . Vaping Use: Never used  Substance and Sexual Activity  . Alcohol use: No  . Drug use: No  . Sexual activity: Yes    Partners: Male    Birth control/protection: Surgical    Comment: tubal  Other Topics Concern  . Not on file  Social History Narrative  . Not on file   Social Determinants of Health   Financial Resource Strain: Not on file  Food Insecurity: Not on file  Transportation Needs: Not on file  Physical Activity: Not on file  Stress: Not on file  Social Connections: Not on file  Intimate Partner Violence: Not on file    Outpatient Medications Prior to Visit  Medication Sig Dispense Refill  . chlorthalidone (HYGROTON) 25 MG tablet TAKE 1 TABLET (25 MG TOTAL) BY MOUTH DAILY. 30 tablet 2  . dicyclomine (BENTYL) 20 MG tablet Take 1 tablet (20 mg total) by mouth 2 (two) times daily. 20 tablet 0  . levothyroxine (SYNTHROID) 175 MCG tablet TAKE 1 TABLET (175 MCG TOTAL) BY MOUTH DAILY BEFORE BREAKFAST. STOP TAKING 30 tablet 3  .  lisinopril (ZESTRIL) 30 MG tablet TAKE 1 TABLET (30 MG TOTAL) BY MOUTH DAILY. TO LOWER BLOOD PRESSURE 30 tablet 3  . Multiple Vitamin (MULTIVITAMIN WITH MINERALS) TABS tablet Take 1 tablet by mouth daily.     No facility-administered medications prior to visit.    Allergies  Allergen Reactions  . Hctz [Hydrochlorothiazide] Other (See Comments)    Chest pain   . Norvasc [Amlodipine Besylate]     CP and dizziness    ROS Review of Systems  Constitutional: Negative for chills and fever.  HENT: Negative.   Eyes: Negative.   Respiratory: Negative for shortness of breath.   Cardiovascular:  Negative for chest pain.  Gastrointestinal: Positive for abdominal distention and abdominal pain. Negative for constipation, diarrhea, nausea and vomiting.  Endocrine: Negative.   Genitourinary: Negative.   Musculoskeletal: Negative.   Skin: Negative.   Allergic/Immunologic: Negative.   Neurological: Negative for weakness and headaches.  Hematological: Negative.   Psychiatric/Behavioral: Negative.       Objective:    Physical Exam Vitals and nursing note reviewed.  Constitutional:      Appearance: She is well-developed. She is obese.  HENT:     Head: Normocephalic and atraumatic.     Right Ear: External ear normal.     Left Ear: External ear normal.     Nose: Nose normal.     Mouth/Throat:     Mouth: Mucous membranes are moist.     Pharynx: Oropharynx is clear.  Eyes:     Extraocular Movements: Extraocular movements intact.     Conjunctiva/sclera: Conjunctivae normal.     Pupils: Pupils are equal, round, and reactive to light.  Cardiovascular:     Rate and Rhythm: Normal rate and regular rhythm.     Pulses: Normal pulses.     Heart sounds: Normal heart sounds.  Pulmonary:     Effort: Pulmonary effort is normal.     Breath sounds: Normal breath sounds.  Abdominal:     General: Bowel sounds are normal. There is distension.     Palpations: Abdomen is soft.     Tenderness: There is abdominal tenderness in the epigastric area. There is no right CVA tenderness or left CVA tenderness.     Comments: Diastasis rectii noted  Musculoskeletal:        General: Normal range of motion.     Cervical back: Normal range of motion and neck supple.  Skin:    General: Skin is warm and dry.  Neurological:     General: No focal deficit present.     Mental Status: She is alert and oriented to person, place, and time.  Psychiatric:        Mood and Affect: Mood normal.        Behavior: Behavior normal.        Judgment: Judgment normal.     BP (!) 156/85 (BP Location: Left Arm, Patient  Position: Sitting, Cuff Size: Normal)   Pulse 69   Temp 98.2 F (36.8 C) (Oral)   Resp 18   Ht 5' (1.524 m)   Wt 183 lb (83 kg)   SpO2 100%   BMI 35.74 kg/m  Wt Readings from Last 3 Encounters:  11/09/20 183 lb (83 kg)  07/22/20 180 lb 12.8 oz (82 kg)  04/22/20 180 lb (81.6 kg)     Health Maintenance Due  Topic Date Due  . COVID-19 Vaccine (1) Never done  . COLONOSCOPY (Pts 45-68yrs Insurance coverage will need to be confirmed)  Never done  . MAMMOGRAM  02/16/2017    There are no preventive care reminders to display for this patient.  Lab Results  Component Value Date   TSH 14.700 (H) 07/22/2020   Lab Results  Component Value Date   WBC 8.6 10/15/2020   HGB 15.9 (H) 10/15/2020   HCT 46.3 (H) 10/15/2020   MCV 92.2 10/15/2020   PLT 341 10/15/2020   Lab Results  Component Value Date   NA 137 10/15/2020   K 3.8 10/15/2020   CO2 26 10/15/2020   GLUCOSE 112 (H) 10/15/2020   BUN 15 10/15/2020   CREATININE 0.80 10/15/2020   BILITOT 0.9 10/15/2020   ALKPHOS 75 10/15/2020   AST 26 10/15/2020   ALT 23 10/15/2020   PROT 8.1 10/15/2020   ALBUMIN 4.4 10/15/2020   CALCIUM 10.0 10/15/2020   ANIONGAP 9 10/15/2020   Lab Results  Component Value Date   CHOL 179 06/12/2019   Lab Results  Component Value Date   HDL 46 06/12/2019   Lab Results  Component Value Date   LDLCALC 97 06/12/2019   Lab Results  Component Value Date   TRIG 210 (H) 06/12/2019   Lab Results  Component Value Date   CHOLHDL 3.9 06/12/2019   Lab Results  Component Value Date   HGBA1C 6.1 (A) 04/22/2020      Assessment & Plan:   Problem List Items Addressed This Visit      Digestive   Gastroesophageal reflux disease without esophagitis   Relevant Medications   pantoprazole (PROTONIX) 40 MG tablet     Musculoskeletal and Integument   Diastasis of rectus abdominis     Other   Obesity (Chronic)   Abdominal pain, epigastric - Primary   Relevant Medications   pantoprazole  (PROTONIX) 40 MG tablet   Other Relevant Orders   H Pylori, IGM, IGG, IGA AB    1. Abdominal pain, epigastric Trial Protonix, patient education given on GERD lifestyle modifications.  Red flags given for prompt reevaluation - H Pylori, IGM, IGG, IGA AB - pantoprazole (PROTONIX) 40 MG tablet; Take 1 tablet (40 mg total) by mouth daily.  Dispense: 30 tablet; Refill: 3  2. Gastroesophageal reflux disease without esophagitis  - pantoprazole (PROTONIX) 40 MG tablet; Take 1 tablet (40 mg total) by mouth daily.  Dispense: 30 tablet; Refill: 3  3. Diastasis of rectus abdominis Patient education given on lifestyle modifications  4. Class 2 severe obesity due to excess calories with serious comorbidity and body mass index (BMI) of 35.0 to 35.9 in adult [facility])   I have reviewed the patient's medical history (PMH, PSH, Social History, Family History, Medications, and allergies) , and have been updated if relevant. I spent 32 minutes reviewing chart and  face to face time with patient.      Meds ordered this encounter  Medications  . pantoprazole (PROTONIX) 40 MG tablet    Sig: Take 1 tablet (40 mg total) by mouth daily.    Dispense:  30 tablet    Refill:  3    Order Specific Question:   Supervising Provider    Answer:   Storm Frisk [[id number]]    Follow-up: Return if symptoms worsen or fail to improve.    Kasandra Knudsen Mayers, PA-C

## 2020-11-09 NOTE — Patient Instructions (Signed)
I encourage you to take the Protonix on a daily basis, I have enclosed information about modifying your diet until your results return.  We will call you with your lab results.  Roney Jaffeari S. Mayers, PA-C Physician Assistant [facility] [email or web address]    Opciones de alimentos para pacientes adultos con enfermedad de reflujo gastroesofgico Food Choices for Gastroesophageal Reflux Disease, Adult Si tiene enfermedad de reflujo gastroesofgico (ERGE), los alimentos que consume y los hbitos de alimentacin son muy importantes. Elegir los alimentos adecuados puede ayudar a Paramedicaliviar las molestias ocasionadas por la Hartford FinancialERGE. Considere la posibilidad de trabajar con un nutricionista que lo ayude a Software engineerhacer elecciones saludables. Consejos para seguir Consulting civil engineereste plan Leer las etiquetas de los alimentos  Elija alimentos que tengan bajo contenido de grasas saturadas. Los alimentos que tienen menos del 5% de los valores diarios (VD) de grasa y 0 g de grasas trans pueden aliviar los sntomas. Al cocinar  Evite frer los alimentos a la hora de la coccin. En su lugar, puede hacerlos al horno, al vapor, a la plancha o en la parrilla. Estos son mtodos que no necesitan mucha grasa para Water quality scientistcocinar.  Para agregar sabor, trate de consumir hierbas con bajo contenido de picante y Palauacidez. Planificacin de las comidas  Elija alimentos saludables con bajo contenido de [place] Cliffgrasa, como frutas, verduras, cereales integrales, productos lcteos descremados, carne magra de vaca, de pescado y de [place].  Haga comidas pequeas con frecuencia en lugar de tres comidas abundantes al da. Coma lentamente, en un ambiente distendido. Evite agacharse o recostarse hasta 2 o 3horas despus de haber comido.  Limite los alimentos con alto contenido graso como las carnes grasas o los alimentos fritos.  Limite su ingesta de alimentos grasos, como aceites, [place]eca y Temec[place]amargarina.  Si el mdico se lo  ha indicado, evite lo siguiente: ? Consumir alimentos que le ocasionen sntomas. Pueden ser distintos para cada persona. Lleve un registro de los alimentos para identificar aquellos que le causen sntomas. ? Alcohol. ? Beber grandes cantidades de lquido con las comidas. ? Comer 2 o 3 horas antes de acostarse.   Estilo de vida  Mantenga un peso saludable. Pregunte a su mdico cul es un peso saludable para usted. Si debe perder peso, hable con su mdico para hacerlo de manera segura.  Realice actividad fsica durante, al menos, 30 minutos 5 das por semana o ms, o segn lo indicado por su mdico.  Evite usar ropa ajustada alrededor de la cintura y Naval architectel pecho.  No consuma ningn producto que contenga nicotina o tabaco. Estos productos incluyen cigarrillos, tabaco para Theatre managermascar y aparatos de vapeo, como los Administrator, Civil Servicecigarrillos electrnicos. Si necesita ayuda para dejar de fumar, consulte al mdico.  Duerma con la cabecera de la cama elevada. Use una cua debajo del colchn o bloques debajo del armazn de la cama para Pharmacologistmantener la cabecera de la cama elevada.  Mastique chicle sin azcar despus de las comidas. Qu alimentos debo comer? Siga una dieta saludable y bien equilibrada que incluya abundantes frutas, verduras, cereales integrales, productos lcteos descremados, carnes magras, pescado y aves. Cada persona es diferente. Los alimentos que pueden desencadenar sntomas en una persona pueden no desencadenar ningn sntoma en otra. Trabaje con el mdico para identificar cules son los alimentos seguros para usted. Es posible que los productos mencionados arriba no formen una lista completa de las bebidas o los alimentos recomendados. Consulte a un nutricionista para obtener ms informacin.   Qu alimentos debo evitar? Limitar algunos de estos alimentos  puede ayudar a [employer] de la enfermedad de reflujo gastroesofgico [place]). Cada persona es diferente. Consulte a un nutricionista o al  mdico para que lo ayude a identificar los alimentos exactos que debe evitar, si los hubiere. Frutas Cualquier fruta que est preparada con grasa agregada. Cualquier fruta que le ocasione sntomas. Para algunas personas, estas pueden incluir, las frutas ctricas como naranja, pomelo, pia y limn. Verduras Verduras fritas en abundante aceite. Papas fritas. Cualquier verdura que est preparada con grasa agregada. Cualquier verdura que le ocasione sntomas. Para algunas personas, estas pueden incluir tomates y productos con tomate, [place], cebollas y [place], y rbanos picantes. Granos Pasteles o panes sin levadura con grasa agregada. Carnes y [address] protenas [address] de alto contenido graso como carne grasa de vaca o cerdo, salchichas, costillas, jamn, salchicha, salame y tocino. Carnes o protenas fritas, lo que incluye pescado frito y pollo frito. Frutos secos y Civil engineer, contracting de frutos secos, en grandes cantidades. Lcteos Leche entera y [place] con chocolate. Tami Ribas. Crema. Helado. Queso crema. Batidos con [employer]. Grasas y [employer]. Margarina. Lardo. Mantequilla clarificada. Bebidas Caf y t negro, con o sin cafena. Bebidas con gas. Refrescos. Bebidas energizantes. Jugo de fruta hecho con frutas cidas, como naranja o pomelo. Jugo de tomate. Bebidas alcohlicas. Dulces y postres Chocolate y cacao. Rosquillas. Alios y condimentos Pimienta. Menta y mentol. Sal agregada. Cualquier condimento, hierbas o aderezos que le ocasionen sntomas. Para algunas personas, esto puede incluir curry, salsa picante o aderezos para ensalada a base de vinagre. Es posible que los productos que se enumeran ms arriba no sean una lista completa de los alimentos y las bebidas que se Theatre stage manager. Consulte a un nutricionista para obtener ms informacin. Preguntas para hacerle al [employer] Los cambios en la dieta y en el estilo de vida habitualmente son los primeros pasos que se toman para Company secretary los sntomas de  [place]. Si los cambios en la dieta y en el estilo de vida no mejoran sus sntomas, hable con el mdico sobre medicamentos. Dnde buscar ms informacin  Arts development officer for Gastrointestinal Disorders (Fundacin Internacional para los Trastornos Gastrointestinales): aboutgerd.org Resumen  Si tiene enfermedad por reflujo gastroesofgico (ERGE), las elecciones de alimentos y estilo de vida pueden ser muy tiles para ayudar a Paramedic las molestias de la [place].  Haga comidas pequeas con frecuencia en lugar de tres comidas abundantes al da. Coma lentamente, en un ambiente distendido. Evite agacharse o recostarse hasta 2 o 3horas despus de haber comido.  Limite los alimentos con alto contenido graso como las carnes grasas o los alimentos fritos. Esta informacin no tiene Theme park manager el consejo del mdico. Asegrese de hacerle al mdico cualquier pregunta que tenga. Document Revised: 01/26/2020 Document Reviewed: 01/26/2020 Elsevier Patient Education  2021 Elsevier Inc.   Distasis de rectos abdominales Diastasis Recti  La distasis de rectos abdominales es una afeccin que se produce cuando los msculos del abdomen (msculos rectos abdominales) se hacen delgados y se separan. Como resultado, hay un espacio ms ancho entre los msculos del lado derecho y del lado izquierdo del abdomen (msculos abdominales). Este espacio ms ancho entre los msculos puede hacer que se forme un bulto en el medio del abdomen. Este bulto puede notarse cuando una persona est haciendo esfuerzo o cuando se sienta despus de acostarse. La distasis de rectos abdominales puede afectar a hombres y mujeres. Suele producirse con mayor frecuencia entre mujeres embarazadas, bebs, personas con obesidad y personas que han tenido una ciruga abdominal. El ejercicio o  la ciruga pueden ayudar a corregir esta afeccin. Cules son las causas? Las causas ms frecuentes de esta afeccin incluyen las  siguientes:  Psychiatrist. A medida que el tero se agranda, ejerce presin [employer] abdominales, y esto hace que los msculos se separen.  Obesidad. El exceso de grasa ejerce presin [employer] msculos abdominales.  Levantar peso.  Algunos ejercicios del abdomen.  Edad avanzada.  Factores genticos.  Haberse sometido a una ciruga de abdomen. Qu incrementa el riesgo? Es ms probable que esta afeccin se manifieste en:  Mujeres.  Recin nacidos, en especial, aquellos que han nacido antes (prematuros). Cules son los signos o sntomas? Los sntomas frecuentes de esta afeccin [employer] siguientes:  Un bulto en el medio del abdomen. Se observa principalmente cuando se sienta o al hacer un esfuerzo.  Dolor en la parte baja de la espalda, las caderas o la zona que est entre los huesos de la cadera (pelvis).  Estreimiento.  Incapacidad para controlar la orina (incontinencia urinaria).  Distensin.  Mala postura. Cmo se diagnostica? Esta afeccin se diagnostica mediante un examen fsico. Durante el examen, el mdico le pedir que se recueste boca arriba y que haga un abdominal completo o hasta la mitad. Si usted tiene distasis de rectos abdominales, aparecer un bulto longitudinal entre los msculos abdominales en el centro del abdomen. Su mdico medir el espacio entre los msculos utilizando alguno de los siguientes mtodos:  Un dispositivo mdico que se [place] para medir el espacio entre dos objetos (calibre).  Luevenia Maxin.  Exploracin por tomografa computarizada (TC).  Ecografa.  Los dedos. El mdico medir el espacio utilizando sus dedos. Cmo se trata? Si la separacin de los msculos no es demasiado grande, quizs no Customer service manager. Sin embargo, si usted es una mujer y planea quedar embarazada nuevamente, debe tratar este problema antes del siguiente embarazo. El tratamiento puede incluir:  Ejercicios de fisioterapia para fortalecer  y tensar los msculos abdominales.  Cambios en el estilo de vida, como bajar de peso y Materials engineer.  Analgsicos de venta libre segn sea necesario.  Ciruga para corregir la separacin. Siga estas instrucciones en su casa: Actividad  Retome sus actividades normales segn lo indicado por el mdico. Pregntele al mdico qu actividades son seguras para usted.  Haga los ejercicios como se lo haya indicado el mdico. Al levantar objetos pesados o hacer ejercicios con los msculos abdominales o los msculos del centro del cuerpo que dan estabilidad (msculos del torso), asegrese de Radio producer los ejercicios y los movimientos de forma Geologist, engineering. Si los hace de la [place], puede ayudar a impedir que el problema reaparezca. Instrucciones generales  Si tiene sobrepeso, pdale ayuda al mdico para bajar de [place]. Si baja de peso aunque sea un poco, puede ayudar a mejorar la distasis de rectos abdominales.  Use los medicamentos de venta libre o los recetados solamente como se lo haya indicado el mdico.  No haga esfuerzos. El esfuerzo puede [employer] separacin. Los siguientes son algunos ejemplos de esfuerzos: ? Hacer mucha fuerza para tener una deposicin, por ejemplo, cuando se tiene estreimiento. ? Levantar objetos pesados o levantar nios. ? Pararse y sentarse.  Es posible que tenga que tomar estas medidas para prevenir o tratar el estreimiento: ? Product manager suficiente lquido como para Pharmacologist la orina de color amarillo plido. ? Usar medicamentos recetados o de Sales promotion account executive. ? Consumir alimentos ricos en fibra, como frijoles, cereales integrales, y frutas y verduras frescas. ? Limitar el consumo de alimentos  ricos en grasa y azcares procesados, como los alimentos fritos o dulces.  Cumpla con todas las visitas de seguimiento. Esto es importante. Comunquese con un mdico si:  Observa un nuevo bulto en el abdomen. Solicite ayuda de inmediato si:  Tiene molestias importantes en  el abdomen.  Tiene dolor abdominal intenso, junto con nuseas, vmitos o fiebre. Resumen  La distasis de rectos abdominales es una afeccin que se produce cuando los msculos del abdomen (msculosrectos abdominales) se hacen delgados y se separan. Puede notar un bulto en el abdomen porque el espacio se ha ensanchado entre los msculos del lado derecho y el lado izquierdo del abdomen.  El sntoma ms frecuente es un bulto en el medio del abdomen. Se observa principalmente cuando se sienta o al hacer un esfuerzo.  Esta afeccin se diagnostica mediante un examen fsico.  Si la separacin de los msculos no es demasiado grande, quizs no Customer service manager. De lo contrario, quizs deba hacer fisioterapia o someterse a [place]. Esta informacin no tiene Theme park manager el consejo del mdico. Asegrese de hacerle al mdico cualquier pregunta que tenga. Document Revised: 04/16/2020 Document Reviewed: 03/01/2020 Elsevier Patient Education  2021 [employer].

## 2020-11-09 NOTE — Progress Notes (Signed)
Patient has not taken medication today, last taken yesterday morning. Patient has eaten today. Patient denies pain at this time. Patient shares Bentyl was helping with the pain. Patient is currently out of medication. Patient reports bloating intermittently.

## 2020-11-10 DIAGNOSIS — K219 Gastro-esophageal reflux disease without esophagitis: Secondary | ICD-10-CM | POA: Insufficient documentation

## 2020-11-10 DIAGNOSIS — M6208 Separation of muscle (nontraumatic), other site: Secondary | ICD-10-CM | POA: Insufficient documentation

## 2020-11-11 ENCOUNTER — Encounter: Payer: Self-pay | Admitting: Physician Assistant

## 2020-11-11 ENCOUNTER — Other Ambulatory Visit: Payer: Self-pay

## 2020-11-11 DIAGNOSIS — A048 Other specified bacterial intestinal infections: Secondary | ICD-10-CM | POA: Insufficient documentation

## 2020-11-11 LAB — H PYLORI, IGM, IGG, IGA AB
H pylori, IgM Abs: <9 units (ref 0.0–8.9)
H. pylori, IgA Abs: 20.7 units — ABNORMAL HIGH (ref 0.0–8.9)
H. pylori, IgG AbS: 2.65 Index Value — ABNORMAL HIGH (ref 0.00–0.79)

## 2020-11-11 MED ORDER — AMOXICILLIN 500 MG PO CAPS
1000.0000 mg | ORAL_CAPSULE | Freq: Two times a day (BID) | ORAL | 0 refills | Status: AC
  Filled 2020-11-11: qty 56, 14d supply, fill #0

## 2020-11-11 MED ORDER — CLARITHROMYCIN 500 MG PO TABS
500.0000 mg | ORAL_TABLET | Freq: Two times a day (BID) | ORAL | 0 refills | Status: AC
  Filled 2020-11-11: qty 28, 14d supply, fill #0

## 2020-11-11 NOTE — Addendum Note (Signed)
Addended by: Roney Jaffe on: 11/11/2020 10:23 AM   Modules accepted: Orders

## 2020-11-12 ENCOUNTER — Other Ambulatory Visit: Payer: Self-pay

## 2020-11-18 ENCOUNTER — Ambulatory Visit: Payer: Self-pay | Admitting: Internal Medicine

## 2020-11-18 ENCOUNTER — Telehealth: Payer: Self-pay | Admitting: *Deleted

## 2020-11-18 NOTE — Telephone Encounter (Signed)
Medical Assistant used [employer] to contact patient.  Interpreter Name: Marylynn Pearson Interpreter #: [id number] UTR x2 patient and no VM was able to be left.

## 2020-11-18 NOTE — Telephone Encounter (Signed)
-----   Message from Roney Jaffe, [place] sent at 11/11/2020 10:23 AM EDT ----- Please call patient and let her know that she did test positive for H. pylori.  She will take amoxicillin and clarithromycin twice a day.  She will take these for 14 days.  She needs to continue taking the Protonix on a daily basis as well.  Please ask her to return to the mobile unit after she has completed her antibiotics.  Please let her know that I am also going to send information regarding H. pylori to her MyChart account

## 2020-12-09 ENCOUNTER — Ambulatory Visit: Payer: Self-pay | Attending: Internal Medicine | Admitting: Internal Medicine

## 2020-12-09 ENCOUNTER — Encounter: Payer: Self-pay | Admitting: Internal Medicine

## 2020-12-09 ENCOUNTER — Other Ambulatory Visit: Payer: Self-pay

## 2020-12-09 VITALS — BP 160/90 | HR 63 | Resp 16 | Wt 190.2 lb

## 2020-12-09 DIAGNOSIS — Z1211 Encounter for screening for malignant neoplasm of colon: Secondary | ICD-10-CM

## 2020-12-09 DIAGNOSIS — I10 Essential (primary) hypertension: Secondary | ICD-10-CM

## 2020-12-09 DIAGNOSIS — Z1231 Encounter for screening mammogram for malignant neoplasm of breast: Secondary | ICD-10-CM

## 2020-12-09 DIAGNOSIS — E039 Hypothyroidism, unspecified: Secondary | ICD-10-CM

## 2020-12-09 DIAGNOSIS — A048 Other specified bacterial intestinal infections: Secondary | ICD-10-CM

## 2020-12-09 MED ORDER — LISINOPRIL 40 MG PO TABS
40.0000 mg | ORAL_TABLET | Freq: Every day | ORAL | 3 refills | Status: DC
  Filled 2020-12-09: qty 30, 30d supply, fill #0
  Filled 2021-01-13: qty 30, 30d supply, fill #1
  Filled 2021-03-02: qty 30, 30d supply, fill #2
  Filled 2021-04-04: qty 30, 30d supply, fill #3
  Filled 2021-05-09: qty 30, 30d supply, fill #4
  Filled 2021-06-07: qty 30, 30d supply, fill #5
  Filled 2021-07-08: qty 30, 30d supply, fill #0
  Filled 2021-08-18: qty 30, 30d supply, fill #1
  Filled 2021-09-28: qty 30, 30d supply, fill #2

## 2020-12-09 NOTE — Progress Notes (Signed)
Patient ID: Caroline Oneal, female    DOB: September 14, 1960  MRN: [id number]  CC: Hypertension and Hypothyroidism   Subjective: Caroline Oneal is a 60 y.o. female who presents for chronic  ds management Her concerns today include:  HTN, HL, hypothyroidism, pre-DM.  Does not have meds with her  HTN: compliant with Lisinopril 30 mg and Chlorthalidone 25 mg daily and low salt diet.  Has taken meds already for the morning.   No device to check BP  Hypothyroid:  taking Levothyroxine.  TSH not at goal 06/2020.  Dose increased from 150 mcg to 175 mg daily.  However, she continued to taking the 150 mcg.  She thinks the higher dose will adversely affect her heart   Found lump above belly button recently that she would like for me to eval.  No pain. Wants x-ray. Saw our physician assistant about a month ago with some abdominal symptoms.  Tested positive for H. pylori and was treated.  Patient Active Problem List   Diagnosis Date Noted   H. pylori infection 11/11/2020   Gastroesophageal reflux disease without esophagitis 11/10/2020   Diastasis of rectus abdominis 11/10/2020   Hyperlipidemia 11/18/2017   Cough 12/14/2016   Poor dentition 08/04/2015   Abdominal pain, epigastric 07/19/2015   Allergic rhinitis 01/13/2015   Obesity 06/03/2014   Prediabetes 06/03/2014   Hypothyroidism 06/03/2014   HTN (hypertension) 10/13/2011     Current Outpatient Medications on File Prior to Visit  Medication Sig Dispense Refill   chlorthalidone (HYGROTON) 25 MG tablet TAKE 1 TABLET (25 MG TOTAL) BY MOUTH DAILY. 30 tablet 2   dicyclomine (BENTYL) 20 MG tablet Take 1 tablet (20 mg total) by mouth 2 (two) times daily. 20 tablet 0   levothyroxine (SYNTHROID) 175 MCG tablet TAKE 1 TABLET (175 MCG TOTAL) BY MOUTH DAILY BEFORE BREAKFAST. STOP TAKING 30 tablet 3   Multiple Vitamin (MULTIVITAMIN WITH MINERALS) TABS tablet Take 1 tablet by mouth daily.     pantoprazole (PROTONIX) 40 MG tablet  Take 1 tablet (40 mg total) by mouth daily. 30 tablet 3   No current facility-administered medications on file prior to visit.    Allergies  Allergen Reactions   Hctz [Hydrochlorothiazide] Other (See Comments)    Chest pain    Norvasc [Amlodipine Besylate]     CP and dizziness    Social History   Socioeconomic History   Marital status: Single    Spouse name: Not on file   Number of children: Not on file   Years of education: Not on file   Highest education level: Not on file  Occupational History   Not on file  Tobacco Use   Smoking status: Never   Smokeless tobacco: Never  Vaping Use   Vaping Use: Never used  Substance and Sexual Activity   Alcohol use: No   Drug use: No   Sexual activity: Yes    Partners: Male    Birth control/protection: Surgical    Comment: tubal  Other Topics Concern   Not on file  Social History Narrative   Not on file   Social Determinants of Health   Financial Resource Strain: Not on file  Food Insecurity: Not on file  Transportation Needs: Not on file  Physical Activity: Not on file  Stress: Not on file  Social Connections: Not on file  Intimate Partner Violence: Not on file    No family history on file.  Past Surgical History:  Procedure Laterality Date  TUBAL LIGATION      ROS: Review of Systems Negative except as stated above  PHYSICAL EXAM: BP (!) 160/90   Pulse 63   Resp 16   Wt 190 lb 3.2 oz (86.3 kg)   SpO2 97%   BMI 37.15 kg/m   Wt Readings from Last 3 Encounters:  12/09/20 190 lb 3.2 oz (86.3 kg)  11/09/20 183 lb (83 kg)  07/22/20 180 lb 12.8 oz (82 kg)    Physical Exam  General appearance - alert, well appearing, and in no distress Mental status - normal mood, behavior, speech, dress, motor activity, and thought processes Chest - clear to auscultation, no wheezes, rales or rhonchi, symmetric air entry Heart - normal rate, regular rhythm, normal S1, S2, no murmurs, rubs, clicks or gallops Abdomen:   Normal bowel sounds.  Nondistended.  No organomegaly.  No umbilical hernia or ventral hernia appreciated.  No boil or bump felt around the umbilicus. Extremities - peripheral pulses normal, no pedal edema, no clubbing or cyanosis   CMP Latest Ref Rng & Units 10/15/2020 07/22/2020 05/24/2020  Glucose 70 - 99 mg/dL 673(A) 193(X) 99  BUN 8 - 23 mg/dL 15 19 23   Creatinine 0.44 - 1.00 mg/dL 9.02 4.09  Sodium 135 - 145 mmol/L 137 141 139  Potassium 3.5 - 5.1 mmol/L 3.8 3.5 4.1  Chloride 98 - 111 mmol/L 102 101 99  CO2 22 - 32 mmol/L 26 22 24   Calcium 8.9 - 10.3 mg/dL 7.35 9.9  Total Protein 6.5 - 8.1 g/dL 8.1 - -  Total Bilirubin 0.3 - 1.2 mg/dL 0.9 - -  Alkaline Phos 38 - 126 U/L 75 - -  AST 15 - 41 U/L 26 - -  ALT 0 - 44 U/L 23 - -   Lipid Panel     Component Value Date/Time   CHOL 179 06/12/2019 1002   TRIG 210 (H) 06/12/2019 1002   HDL 46 06/12/2019 1002   CHOLHDL 3.9 06/12/2019 1002   CHOLHDL 4.0 06/03/2014 1043   VLDL 23 06/03/2014 1043   LDLCALC 97 06/12/2019 1002    CBC    Component Value Date/Time   WBC 8.6 10/15/2020 1644   RBC 5.02 10/15/2020 1644   HGB 15.9 (H) 10/15/2020 1644   HGB 14.4 06/12/2019 1002   HCT 46.3 (H) 10/15/2020 1644   HCT 43.4 06/12/2019 1002   PLT 341 10/15/2020 1644   PLT 308 06/12/2019 1002   MCV 92.2 10/15/2020 1644   MCV 92 06/12/2019 1002   MCH 31.7 10/15/2020 1644   MCHC 34.3 10/15/2020 1644   RDW 12.7 10/15/2020 1644   RDW 13.5 06/12/2019 1002   LYMPHSABS 2.7 10/15/2020 1644   MONOABS 0.9 10/15/2020 1644   EOSABS 0.5 10/15/2020 1644   BASOSABS 0.1 10/15/2020 1644    ASSESSMENT AND PLAN: 1. Essential hypertension Not at goal.  Increase lisinopril to 40 mg.  Continue chlorthalidone. - lisinopril (ZESTRIL) 40 MG tablet; Take 1 tablet (40 mg total) by mouth daily.  Dispense: 90 tablet; Refill: 3  2. Hypothyroidism, unspecified type Encourage patient to take the levothyroxine daily.  She did not increase the dose to 175  mcg as she was advised.  Informed patient that uncontrolled hypothyroidism can cause other issues including adversely affecting the heart.  She is agreeable to having her levels rechecked today.  If not in range, she will agree to take the higher dose. - TSH  3. Screening for colon cancer - Fecal occult blood, imunochemical([facility])  4. Encounter for screening mammogram for malignant neoplasm of breast - MM Digital Screening; Future  5. Bacterial infection due to Helicobacter pylori - H. pylori breath test   Patient was given the opportunity to ask questions.  Patient verbalized understanding of the plan and was able to repeat key elements of the plan.  [employer] Language interpreter used during this encounter. #[id number] Bosworth  Orders Placed This Encounter  Procedures   Fecal occult blood, imunochemical([facility])   MM Digital Screening   TSH   H. pylori breath test     Requested Prescriptions   Signed Prescriptions Disp Refills   lisinopril (ZESTRIL) 40 MG tablet 90 tablet 3    Sig: Take 1 tablet (40 mg total) by mouth daily.    Return in about 4 months (around 04/10/2021).  Jonah Blue, MD, FACP

## 2020-12-10 LAB — H. PYLORI BREATH TEST: H pylori Breath Test: NEGATIVE

## 2020-12-10 LAB — TSH: TSH: 55.1 u[IU]/mL — ABNORMAL HIGH (ref 0.450–4.500)

## 2020-12-11 NOTE — Progress Notes (Signed)
Let patient know that the H. pylori breath test which screens for this bacteria in the stomach is negative.  Her thyroid level is significantly abnormal.  Please confirm whether or not she has been taking the levothyroxine 150 mcg consistently every day for the past 2 to 3 months.  If she has been taking it consistently, then we need to increase the dose to 175 mcg daily.  Please let me know her response so I know whether to send rxn with higher dose to pharmacy.  If we do have to increase the dose, she should return to the lab in 1 month after being on the higher dose to have her levels rechecked.

## 2020-12-13 ENCOUNTER — Other Ambulatory Visit: Payer: Self-pay

## 2020-12-13 MED FILL — Chlorthalidone Tab 25 MG: ORAL | 30 days supply | Qty: 30 | Fill #1 | Status: AC

## 2020-12-15 ENCOUNTER — Telehealth: Payer: Self-pay | Admitting: Internal Medicine

## 2020-12-15 NOTE — Telephone Encounter (Signed)
Will forward to provider for FYI 

## 2020-12-15 NOTE — Telephone Encounter (Signed)
Pt states she is taking Levothyroxine as instructed (175 mg)  and is informed of H.Pylori result.

## 2020-12-16 LAB — FECAL OCCULT BLOOD, IMMUNOCHEMICAL: Fecal Occult Bld: NEGATIVE

## 2020-12-21 ENCOUNTER — Ambulatory Visit: Payer: Self-pay | Attending: Internal Medicine

## 2020-12-21 ENCOUNTER — Other Ambulatory Visit: Payer: Self-pay

## 2020-12-24 ENCOUNTER — Telehealth: Payer: Self-pay

## 2020-12-24 NOTE — Telephone Encounter (Signed)
Contacted pt to go over lab results pt didn't answer and was unable to lvm due to vm not being set up  [facility] ID [id number]  Sent a CRM and forward labs to NT to give pt labs when they call back

## 2021-01-12 ENCOUNTER — Other Ambulatory Visit: Payer: Self-pay | Admitting: Obstetrics and Gynecology

## 2021-01-12 DIAGNOSIS — Z1231 Encounter for screening mammogram for malignant neoplasm of breast: Secondary | ICD-10-CM

## 2021-01-13 ENCOUNTER — Other Ambulatory Visit: Payer: Self-pay | Admitting: Physician Assistant

## 2021-01-13 DIAGNOSIS — I10 Essential (primary) hypertension: Secondary | ICD-10-CM

## 2021-01-13 MED FILL — Levothyroxine Sodium Tab 175 MCG: ORAL | 30 days supply | Qty: 30 | Fill #1 | Status: AC

## 2021-01-14 ENCOUNTER — Other Ambulatory Visit: Payer: Self-pay

## 2021-01-14 MED ORDER — CHLORTHALIDONE 25 MG PO TABS
ORAL_TABLET | Freq: Every day | ORAL | 2 refills | Status: DC
  Filled 2021-01-14: qty 30, 30d supply, fill #0
  Filled 2021-03-02: qty 30, 30d supply, fill #1
  Filled 2021-04-04: qty 30, 30d supply, fill #2

## 2021-02-21 ENCOUNTER — Other Ambulatory Visit: Payer: Self-pay | Admitting: Obstetrics and Gynecology

## 2021-02-21 DIAGNOSIS — Z1231 Encounter for screening mammogram for malignant neoplasm of breast: Secondary | ICD-10-CM

## 2021-02-22 ENCOUNTER — Other Ambulatory Visit: Payer: Self-pay

## 2021-02-22 ENCOUNTER — Ambulatory Visit
Admission: RE | Admit: 2021-02-22 | Discharge: 2021-02-22 | Disposition: A | Discharge status: Home / Self Care | Payer: No Typology Code available for payment source | Source: Ambulatory Visit | Attending: Obstetrics and Gynecology | Admitting: Obstetrics and Gynecology

## 2021-02-22 ENCOUNTER — Ambulatory Visit: Payer: Self-pay | Admitting: *Deleted

## 2021-02-22 VITALS — BP 140/78 | Wt 190.0 lb

## 2021-02-22 DIAGNOSIS — Z1239 Encounter for other screening for malignant neoplasm of breast: Secondary | ICD-10-CM

## 2021-02-22 DIAGNOSIS — Z1231 Encounter for screening mammogram for malignant neoplasm of breast: Secondary | ICD-10-CM

## 2021-02-22 NOTE — Progress Notes (Signed)
Ms. Bryla Burek is a 60 y.o. female who presents to [facility] clinic today with no complaints.    Pap Smear: Pap smear not completed today. Last Pap smear was 07/25/2018 at [facility] and was normal with negative HPV. Per patient has no history of an abnormal Pap smear. Last Pap smear result is available in Epic.   Physical exam: Breasts Breasts symmetrical. No skin abnormalities bilateral breasts. No nipple retraction bilateral breasts. No nipple discharge bilateral breasts. No lymphadenopathy. No lumps palpated bilateral breasts. No complaints of pain or tenderness on exam.   Pelvic/Bimanual Pap is not indicated today per BCCCP guidelines.    Smoking History: Patient has never smoked.   Patient Navigation: Patient education provided. Access to services provided for patient through [facility] program. Spanish interpreter Natale Lay from [facility] provided.   Colorectal Cancer Screening: Per patient has never had colonoscopy completed. FIT Test completed 12/15/2020 that was negative. No complaints today.    Breast and Cervical Cancer Risk Assessment: Patient does not have family history of breast cancer, known genetic mutations, or radiation treatment to the chest before age 59. Patient does not have history of cervical dysplasia, immunocompromised, or DES exposure in-utero.  Risk Assessment     Risk Scores       02/22/2021   Last edited by: Narda Rutherford, LPN   5-year risk: 0.6 %   Lifetime risk: 3.3 %            A: BCCCP exam without pap smear No complaints.  P: [facility] to [facility] for a screening mammogram on mobile unit. Appointment scheduled Tuesday, February 22, 2021 at 1420.  Priscille Heidelberg, RN 02/22/2021 1:55 PM

## 2021-02-22 NOTE — Patient Instructions (Signed)
Explained breast self awareness with [name]. Patient did not need a Pap smear today due to last Pap smear and HPV typing was 07/25/2018. Let her know [facility] will cover Pap smears and HPV typing every 5 years unless has a history of abnormal Pap smears. Referred patient to the [facility] for a screening mammogram on mobile unit. Appointment scheduled Tuesday, February 22, 2021 at 1420. Patient escorted to the mobile unit following [facility] appointment for her screening mammogram. Let patient know the Breast Center will follow up with her within the next couple weeks with results of her mammogram by letter or phone. Derenda Fennel Berrospe verbalized understanding.  Treavor Blomquist, Kathaleen Maser, RN 1:54 PM

## 2021-03-02 ENCOUNTER — Other Ambulatory Visit: Payer: Self-pay

## 2021-03-02 MED FILL — Levothyroxine Sodium Tab 175 MCG: ORAL | 30 days supply | Qty: 30 | Fill #2 | Status: AC

## 2021-04-04 ENCOUNTER — Other Ambulatory Visit: Payer: Self-pay

## 2021-04-04 MED FILL — Levothyroxine Sodium Tab 175 MCG: ORAL | 30 days supply | Qty: 30 | Fill #3 | Status: AC

## 2021-04-11 ENCOUNTER — Other Ambulatory Visit: Payer: Self-pay

## 2021-04-11 ENCOUNTER — Ambulatory Visit: Payer: Self-pay | Attending: Internal Medicine | Admitting: Internal Medicine

## 2021-04-11 ENCOUNTER — Encounter: Payer: Self-pay | Admitting: Internal Medicine

## 2021-04-11 VITALS — BP 110/73 | HR 66 | Resp 16 | Wt 180.0 lb

## 2021-04-11 DIAGNOSIS — Z23 Encounter for immunization: Secondary | ICD-10-CM

## 2021-04-11 DIAGNOSIS — Z2821 Immunization not carried out because of patient refusal: Secondary | ICD-10-CM

## 2021-04-11 DIAGNOSIS — E669 Obesity, unspecified: Secondary | ICD-10-CM

## 2021-04-11 DIAGNOSIS — E039 Hypothyroidism, unspecified: Secondary | ICD-10-CM

## 2021-04-11 DIAGNOSIS — I10 Essential (primary) hypertension: Secondary | ICD-10-CM

## 2021-04-11 NOTE — Progress Notes (Signed)
Patient ID: Caroline Oneal, female    DOB: [birth year]  MRN: [id number]  CC: chronic ds management   Subjective: Caroline Oneal is a 60 y.o. female who presents for chronic ds management Her concerns today include:  HTN, HL, hypothyroidism, pre-DM, obesity.  Pt does not have meds with her.  HTN: Reports compliance with lisinopril 40 mg mg daily and chlorthalidone 25 mg daily and low-salt diet.  Took meds already today and limits salt in foods. -no LE edema, CP or SOB -no device at home to check BP  Hypothyroidism: On last visit TSH was 55.  I recommend increasing the levothyroxine from 150 mcg daily to 175 mcg daily. Reports compliance with the med -denies any feeling hot or cold all the time. Endorses a little hair loss.    Obesity:  Trying to eat healthier foods.  She walks daily for about an hour with her granddaughter.  Weight is down 10 pounds from last visit.  Saw a doctor since last visit with [place] because she was having problems sleeping.  Placed on a med that is 50 mg.  She does not recall the name.  She feels the medicine is too strong so she stopped taking it.  HM:  Due for flu shot and shingles vaccine. Declines Shingrix.  Declines COVID vaccine Patient Active Problem List   Diagnosis Date Noted   H. pylori infection 11/11/2020   Gastroesophageal reflux disease without esophagitis 11/10/2020   Diastasis of rectus abdominis 11/10/2020   Hyperlipidemia 11/18/2017   Cough 12/14/2016   Poor dentition 08/04/2015   Abdominal pain, epigastric 07/19/2015   Allergic rhinitis 01/13/2015   Obesity 06/03/2014   Prediabetes 06/03/2014   Hypothyroidism 06/03/2014   HTN (hypertension) 10/13/2011     Current Outpatient Medications on File Prior to Visit  Medication Sig Dispense Refill   chlorthalidone (HYGROTON) 25 MG tablet TAKE 1 TABLET (25 MG TOTAL) BY MOUTH DAILY. 30 tablet 2   dicyclomine (BENTYL) 20 MG tablet Take 1 tablet (20 mg total) by mouth 2 (two)  times daily. 20 tablet 0   levothyroxine (SYNTHROID) 175 MCG tablet TAKE 1 TABLET (175 MCG TOTAL) BY MOUTH DAILY BEFORE BREAKFAST. STOP TAKING 30 tablet 3   lisinopril (ZESTRIL) 40 MG tablet Take 1 tablet (40 mg total) by mouth daily. 90 tablet 3   Multiple Vitamin (MULTIVITAMIN WITH MINERALS) TABS tablet Take 1 tablet by mouth daily.     pantoprazole (PROTONIX) 40 MG tablet Take 1 tablet (40 mg total) by mouth daily. 30 tablet 3   No current facility-administered medications on file prior to visit.    Allergies  Allergen Reactions   Hctz [Hydrochlorothiazide] Other (See Comments)    Chest pain    Norvasc [Amlodipine Besylate]     CP and dizziness    Social History   Socioeconomic History   Marital status: Single    Spouse name: Not on file   Number of children: 4   Years of education: Not on file   Highest education level: 6th grade  Occupational History   Not on file  Tobacco Use   Smoking status: Never   Smokeless tobacco: Never  Vaping Use   Vaping Use: Never used  Substance and Sexual Activity   Alcohol use: No   Drug use: No   Sexual activity: Not Currently    Partners: Male    Birth control/protection: Surgical    Comment: tubal  Other Topics Concern   Not on file  Social History Narrative   Not on file   Social Determinants of Health   Financial Resource Strain: Not on file  Food Insecurity: Food Insecurity Present   Worried About Running Out of Food in the Last Year: Often true   Barista in the Last Year: Often true  Transportation Needs: Unmet Transportation Needs   Lack of Transportation (Medical): Yes   Lack of Transportation (Non-Medical): Yes  Physical Activity: Not on file  Stress: Not on file  Social Connections: Not on file  Intimate Partner Violence: Not on file    Family History  Problem Relation Age of Onset   Breast cancer Neg Hx     Past Surgical History:  Procedure Laterality Date   TUBAL LIGATION       ROS: Review of Systems Negative except as stated above  PHYSICAL EXAM: BP 110/73   Pulse 66   Resp 16   Wt 180 lb (81.6 kg)   SpO2 98%   BMI 35.15 kg/m   Wt Readings from Last 3 Encounters:  04/11/21 180 lb (81.6 kg)  02/22/21 190 lb (86.2 kg)  12/09/20 190 lb 3.2 oz (86.3 kg)    Physical Exam  General appearance - alert, well appearing, and in no distress Mental status - normal mood, behavior, speech, dress, motor activity, and thought processes Mouth - mucous membranes moist, pharynx normal without lesions Neck - supple, no significant adenopathy Chest - clear to auscultation, no wheezes, rales or rhonchi, symmetric air entry Heart - normal rate, regular rhythm, normal S1, S2, no murmurs, rubs, clicks or gallops Extremities - peripheral pulses normal, no pedal edema, no clubbing or cyanosis   CMP Latest Ref Rng & Units 10/15/2020 07/22/2020 05/24/2020  Glucose 70 - 99 mg/dL 102(H) 852(D) 99  BUN 8 - 23 mg/dL 15 19 23   Creatinine 0.44 - 1.00 mg/dL 7.82 4.23  Sodium 135 - 145 mmol/L 137 141 139  Potassium 3.5 - 5.1 mmol/L 3.8 3.5 4.1  Chloride 98 - 111 mmol/L 102 101 99  CO2 22 - 32 mmol/L 26 22 24   Calcium 8.9 - 10.3 mg/dL 5.36 9.9  Total Protein 6.5 - 8.1 g/dL 8.1 - -  Total Bilirubin 0.3 - 1.2 mg/dL 0.9 - -  Alkaline Phos 38 - 126 U/L 75 - -  AST 15 - 41 U/L 26 - -  ALT 0 - 44 U/L 23 - -   Lipid Panel     Component Value Date/Time   CHOL 179 06/12/2019 1002   TRIG 210 (H) 06/12/2019 1002   HDL 46 06/12/2019 1002   CHOLHDL 3.9 06/12/2019 1002   CHOLHDL 4.0 06/03/2014 1043   VLDL 23 06/03/2014 1043   LDLCALC 97 06/12/2019 1002    CBC    Component Value Date/Time   WBC 8.6 10/15/2020 1644   RBC 5.02 10/15/2020 1644   HGB 15.9 (H) 10/15/2020 1644   HGB 14.4 06/12/2019 1002   HCT 46.3 (H) 10/15/2020 1644   HCT 43.4 06/12/2019 1002   PLT 341 10/15/2020 1644   PLT 308 06/12/2019 1002   MCV 92.2 10/15/2020 1644   MCV 92 06/12/2019 1002    MCH 31.7 10/15/2020 1644   MCHC 34.3 10/15/2020 1644   RDW 12.7 10/15/2020 1644   RDW 13.5 06/12/2019 1002   LYMPHSABS 2.7 10/15/2020 1644   MONOABS 0.9 10/15/2020 1644   EOSABS 0.5 10/15/2020 1644   BASOSABS 0.1 10/15/2020 1644    ASSESSMENT AND PLAN: 1. Essential  hypertension Blood pressure is at goal.  She will continue lisinopril and chlorthalidone.  2. Acquired hypothyroidism Continue current dose of levothyroxine.  We will recheck TSH today.  Advised patient that if her levels still are not within the range, we will need to increase the dose to 200 mcg daily. - TSH  3. Obesity (BMI 30-39.9) Commended her on weight loss.  Encouraged her to continue regular exercise and healthy eating habits. - Lipid panel - Hemoglobin A1c  4. Need for influenza vaccination Flu shot given.  5. COVID-19 vaccination declined Recommended.  Patient declined.  She also declined Shingrix vaccine for the shingles.  In regards to the sleep medicine, I told the patient that she can bring her bottle with her on the next visit or call us back with the name.   Patient was given the opportunity to ask questions.  Patient verbalized understanding of the plan and was able to repeat key elements of the plan.  [employer] Language interpreter used during this encounter. #[id number], [place]  Orders Placed This Encounter  Procedures   TSH   Lipid panel   Hemoglobin A1c      Requested Prescriptions    No prescriptions requested or ordered in this encounter    No follow-ups on file.  Jonah Blue, MD, FACP

## 2021-04-12 LAB — LIPID PANEL
Chol/HDL Ratio: 5.3 ratio — ABNORMAL HIGH (ref 0.0–4.4)
Cholesterol, Total: 191 mg/dL (ref 100–199)
HDL: 36 mg/dL — ABNORMAL LOW (ref >39)
LDL Chol Calc (NIH): 94 mg/dL (ref 0–99)
Triglycerides: 365 mg/dL — ABNORMAL HIGH (ref 0–149)
VLDL Cholesterol Cal: 61 mg/dL — ABNORMAL HIGH (ref 5–40)

## 2021-04-12 LAB — HEMOGLOBIN A1C
Est. average glucose Bld gHb Est-mCnc: 151 mg/dL
Hgb A1c MFr Bld: 6.9 % — ABNORMAL HIGH (ref 4.8–5.6)

## 2021-04-12 LAB — TSH: TSH: 0.056 u[IU]/mL — ABNORMAL LOW (ref 0.450–4.500)

## 2021-04-13 ENCOUNTER — Other Ambulatory Visit: Payer: Self-pay | Admitting: Internal Medicine

## 2021-04-13 ENCOUNTER — Other Ambulatory Visit: Payer: Self-pay

## 2021-04-13 ENCOUNTER — Telehealth: Payer: Self-pay

## 2021-04-13 DIAGNOSIS — E119 Type 2 diabetes mellitus without complications: Secondary | ICD-10-CM | POA: Insufficient documentation

## 2021-04-13 DIAGNOSIS — E039 Hypothyroidism, unspecified: Secondary | ICD-10-CM

## 2021-04-13 MED ORDER — METFORMIN HCL 500 MG PO TABS
500.0000 mg | ORAL_TABLET | Freq: Every day | ORAL | 3 refills | Status: DC
  Filled 2021-04-13 – 2021-04-26 (×2): qty 30, 30d supply, fill #0

## 2021-04-13 MED ORDER — LEVOTHYROXINE SODIUM 150 MCG PO TABS
150.0000 ug | ORAL_TABLET | Freq: Every day | ORAL | 3 refills | Status: DC
  Filled 2021-04-13 – 2021-04-26 (×2): qty 30, 30d supply, fill #0
  Filled 2021-06-07: qty 30, 30d supply, fill #1
  Filled 2021-07-08: qty 30, 30d supply, fill #0
  Filled 2021-08-18: qty 30, 30d supply, fill #1

## 2021-04-13 MED ORDER — ATORVASTATIN CALCIUM 10 MG PO TABS
10.0000 mg | ORAL_TABLET | Freq: Every day | ORAL | 3 refills | Status: DC
  Filled 2021-04-13 – 2021-04-27 (×3): qty 30, 30d supply, fill #0
  Filled 2021-05-26: qty 30, 30d supply, fill #1
  Filled 2021-06-28 (×2): qty 30, 30d supply, fill #2

## 2021-04-13 NOTE — Progress Notes (Signed)
Let patient know that her thyroid level is off.  She should stop the levothyroxine for 2 weeks then restart it at a lower dose.  New dose will be 150 mcg daily when she restarts the medication.  After she has been on this dose for 4 to 6 weeks, she should return to the laboratory to have her thyroid level rechecked.  -She is now in the range for diabetes.  Healthy eating habits and regular exercise as discussed on recent visit will help in controlling the diabetes.  I will have her scheduled with the clinical pharmacist for diabetic teaching.  In the meantime I recommend starting a low-dose of diabetes medication called metformin that she will take once a day.   -Cholesterol levels are elevated.  The goal for LDL cholesterol in patients with diabetes is less than 70.  She is at 110.  I recommend starting a medication called atorvastatin to help lower cholesterol and in doing so we will decrease her risk for heart attack and strokes.  I have sent these prescriptions to our pharmacy which will include the atorvastatin for cholesterol, metformin for the diabetes, and the lower dose of the levothyroxine.

## 2021-04-13 NOTE — Telephone Encounter (Signed)
Caroline Oneal contacted pt to go over lab results pt didn't answer lvm   Sent a CRM and forward labs to NT to give pt labs when they call back

## 2021-04-20 ENCOUNTER — Other Ambulatory Visit: Payer: Self-pay

## 2021-04-26 ENCOUNTER — Other Ambulatory Visit: Payer: Self-pay

## 2021-04-26 NOTE — Progress Notes (Signed)
S:    PCP: Dr. Laural Benes  Patient presents for diabetes evaluation, education, and management. Patient was referred and last seen by Primary Care Provider on 04/11/21 and was diagnosed with diabetes and started on low dose metformin.   Today, patient arrives in good spirits. States that she has not yet started her new medications because she just picked them up today. She has picked up levothyroxine 150 mg and metformin but not atorvastatin. She does not have a meter at home to check her BG. She reports trying to eat much healthier since she found out she has diabetes.   Patient reports Diabetes was diagnosed October 2022.   Family/Social History:  -Fhx: noncontributory -Tobacco: Never smoker  Insurance coverage/medication affordability: uninsured  Medication adherence denied. She just picked up her medications today.     Current diabetes medications include: metformin 500 mg daily Current hypertension medications include: lisinopril 40 mg daily, chlorthalidone 25 mg daily Current hyperlipidemia medications include: atorvastatin 10 mg daily  Patient denies hypoglycemic events.  Patient reported dietary habits: Reports improving her diet since diagnosis at her daughter's encouragement. Eats lots of vegetables, avoiding red meat, carbs. No dessert, juice, soda.   Patient-reported exercise habits: walks daily for an hour with her granddaughter   Patient reports nocturia (nighttime urination). Once per night.  Patient denies neuropathy (nerve pain). Patient denies visual changes. Patient denies self foot exams. Educated.   O:  POCT BG: 120 (last ate ~4 hours ago)  Lab Results  Component Value Date   HGBA1C 6.9 (H) 04/11/2021   There were no vitals filed for this visit.  Lipid Panel     Component Value Date/Time   CHOL 191 04/11/2021 1414   TRIG 365 (H) 04/11/2021 1414   HDL 36 (L) 04/11/2021 1414   CHOLHDL 5.3 (H) 04/11/2021 1414   CHOLHDL 4.0 06/03/2014 1043   VLDL 23  06/03/2014 1043   LDLCALC 94 04/11/2021 1414    Clinical Atherosclerotic Cardiovascular Disease (ASCVD): No  The 10-year ASCVD risk score (Arnett DK, et al., 2019) is: 8.7%   Values used to calculate the score:     Age: 60 years     Sex: Female     Is Non-Hispanic African American: No     Diabetic: Yes     Tobacco smoker: No     Systolic Blood Pressure: 110 mmHg     Is BP treated: Yes     HDL Cholesterol: 36 mg/dL     Total Cholesterol: 191 mg/dL    A/P: Diabetes newly diagnosed with recent A1c 6.9 on 04/11/21. Patient is able to verbalize appropriate hypoglycemia management plan. Control is suboptimal due to not yet starting medications. She has picked up her medications and will start taking metformin today. Will send in Rx for glucometer so she can start checking her BG at home.  -Start taking metformin 500 mg daily -Start checking BG 2-3 times weekly. Bring meter to next visit.  -Extensively discussed pathophysiology of diabetes, recommended lifestyle interventions, dietary effects on blood sugar control  -Counseled on s/sx of and management of hypoglycemia -Next A1C anticipated January 2023.   Counseled her on need for atorvastatin which she states she will pick up and start taking today. Educated her on the new levothyroxine dose that she picked up today and she confirmed understanding.   Total time in face to face counseling 40 minutes. Follow up Pharmacist Clinic Visit in 3 weeks. Next PCP visit 08/12/21.    Pervis Hocking, PharmD PGY2  Ambulatory Care Pharmacy Resident 04/27/2021 1:35 PM

## 2021-04-27 ENCOUNTER — Ambulatory Visit: Payer: Self-pay | Attending: Internal Medicine | Admitting: Pharmacist

## 2021-04-27 ENCOUNTER — Other Ambulatory Visit: Payer: Self-pay

## 2021-04-27 DIAGNOSIS — E119 Type 2 diabetes mellitus without complications: Secondary | ICD-10-CM

## 2021-04-27 LAB — GLUCOSE, POCT (MANUAL RESULT ENTRY): POC Glucose: 120 mg/dl — AB (ref 70–99)

## 2021-04-27 MED ORDER — TRUEPLUS LANCETS 28G MISC
2 refills | Status: DC
  Filled 2021-04-27: qty 100, 30d supply, fill #0
  Filled 2021-06-14: qty 100, 30d supply, fill #1
  Filled 2021-10-20: qty 100, 30d supply, fill #0

## 2021-04-27 MED ORDER — TRUE METRIX BLOOD GLUCOSE TEST VI STRP
ORAL_STRIP | 2 refills | Status: DC
  Filled 2021-04-27: qty 100, 30d supply, fill #0
  Filled 2021-06-14: qty 100, 30d supply, fill #1
  Filled 2021-10-20: qty 100, 30d supply, fill #0

## 2021-04-27 MED ORDER — TRUE METRIX METER W/DEVICE KIT
PACK | 0 refills | Status: AC
  Filled 2021-04-27: qty 1, 1d supply, fill #0

## 2021-05-09 ENCOUNTER — Other Ambulatory Visit: Payer: Self-pay | Admitting: Internal Medicine

## 2021-05-09 ENCOUNTER — Other Ambulatory Visit: Payer: Self-pay

## 2021-05-09 DIAGNOSIS — I10 Essential (primary) hypertension: Secondary | ICD-10-CM

## 2021-05-09 MED ORDER — CHLORTHALIDONE 25 MG PO TABS
ORAL_TABLET | Freq: Every day | ORAL | 5 refills | Status: DC
  Filled 2021-05-09: qty 30, 30d supply, fill #0

## 2021-05-09 NOTE — Telephone Encounter (Signed)
Requested Prescriptions  Pending Prescriptions Disp Refills  . chlorthalidone (HYGROTON) 25 MG tablet 30 tablet 5    Sig: TAKE 1 TABLET (25 MG TOTAL) BY MOUTH DAILY.     Cardiovascular: Diuretics - Thiazide Passed - 05/09/2021  4:37 PM      Passed - Ca in normal range and within 360 days    Calcium  Date Value Ref Range Status  10/15/2020 10.0 8.9 - 10.3 mg/dL Final         Passed - Cr in normal range and within 360 days    Creat  Date Value Ref Range Status  03/03/2016 0.66 0.50 - 1.05 mg/dL Final    Comment:      For patients > or = 61 years of age: The upper reference limit for Creatinine is approximately 13% higher for people identified as African-American.      Creatinine, Ser  Date Value Ref Range Status  10/15/2020 0.80 0.44 - 1.00 mg/dL Final         Passed - K in normal range and within 360 days    Potassium  Date Value Ref Range Status  10/15/2020 3.8 3.5 - 5.1 mmol/L Final         Passed - Na in normal range and within 360 days    Sodium  Date Value Ref Range Status  10/15/2020 137 135 - 145 mmol/L Final  07/22/2020 141 134 - 144 mmol/L Final         Passed - Last BP in normal range    BP Readings from Last 1 Encounters:  04/11/21 110/73         Passed - Valid encounter within last 6 months    Recent Outpatient Visits          1 week ago Type 2 diabetes mellitus without complication, without long-term current use of insulin [facility])   [facility] [place], Cornelius Moras, RPH-CPP   4 weeks ago Essential hypertension   [facility] Marcine Matar, MD   5 months ago Hypothyroidism, unspecified type   [facility] Marcine Matar, MD   9 months ago Essential hypertension   [facility] [place], Marylene Land P[place]ia [place], [place]   11 months ago Essential hypertension   [facility] Drucilla Chalet, RPH-CPP       Future Appointments            In 1 week Lois Huxley, Cornelius Moras, RPH-CPP [facility]   In 3 months Laural Benes, Binnie Rail, MD [facility]

## 2021-05-10 ENCOUNTER — Other Ambulatory Visit: Payer: Self-pay

## 2021-05-16 NOTE — Progress Notes (Signed)
   S:    PCP: Dr. Laural Benes  Patient presents for diabetes evaluation, education, and management. Patient was referred and last seen by Primary Care Provider on 04/11/21 and was diagnosed with diabetes and started on low dose metformin though at CPP follow up on 11/2 she hadn't yet picked up the metformin so this was started at that time.   Today, patient arrives in good spirits. Visit was completed using a Spanish video interpreter. She reports doing well since starting metformin. Denies any adverse effects or missed doses. She is checking her BG twice daily and does not particularly like pricking her fingers so often.   Patient reports Diabetes was diagnosed October 2022.   Family/Social History:  -Fhx: noncontributory -Tobacco: Never smoker  [employer] affordability: uninsured  Medication adherence reported.   Current diabetes medications include: metformin 500 mg daily Current hypertension medications include: lisinopril 40 mg daily, chlorthalidone 25 mg daily Current hyperlipidemia medications include: atorvastatin 10 mg daily  Patient denies hypoglycemic events.  Patient reported dietary habits: Reports improving her diet since diagnosis at her daughter's encouragement. Eats lots of vegetables, avoiding red meat, carbs. No dessert, juice, soda.   Patient-reported exercise habits: walks daily for an hour with her granddaughter   Patient reports nocturia (nighttime urination). Once per night.  Patient denies neuropathy (nerve pain). Patient denies visual changes. Patient denies self foot exams. Educated.   O:  POCT BG: 116 (last ate ~4 hours ago)  Lab Results  Component Value Date   HGBA1C 6.9 (H) 04/11/2021   There were no vitals filed for this visit.  Fasting BG (AM): 104, 108, 133, 106, 102, 119 7-9pm BG: 96, 88, 101, 110, 88, 114 7d average 104; 14d average 111  Lipid Panel     Component Value Date/Time   CHOL 191 04/11/2021 1414   TRIG 365  (H) 04/11/2021 1414   HDL 36 (L) 04/11/2021 1414   CHOLHDL 5.3 (H) 04/11/2021 1414   CHOLHDL 4.0 06/03/2014 1043   VLDL 23 06/03/2014 1043   LDLCALC 94 04/11/2021 1414    Clinical Atherosclerotic Cardiovascular Disease (ASCVD): No  The 10-year ASCVD risk score (Arnett DK, et al., 2019) is: 8.7%   Values used to calculate the score:     Age: 60 years     Sex: Female     Is Non-Hispanic African American: No     Diabetic: Yes     Tobacco smoker: No     Systolic Blood Pressure: 110 mmHg     Is BP treated: Yes     HDL Cholesterol: 36 mg/dL     Total Cholesterol: 191 mg/dL    A/P: Diabetes newly diagnosed with recent A1c 6.9 on 04/11/21. Patient is able to verbalize appropriate hypoglycemia management plan. All BG are at goal. Will continue current treatment and follow up again with CPP in January to ensure BG still at goal before visit with PCP in February.  -Continue taking metformin 500 mg daily -Decrease frequency of home BG monitoring to 2-3 times weekly given good control. Bring meter to next visit.  -Extensively discussed pathophysiology of diabetes, recommended lifestyle interventions, dietary effects on blood sugar control  -Counseled on s/sx of and management of hypoglycemia -Next A1C anticipated January 2023.   Total time in face to face counseling 40 minutes. Follow up Pharmacist Clinic Visit in 6 weeks in January. Next PCP visit 08/12/21.    Pervis Hocking, PharmD PGY2 Ambulatory Care Pharmacy Resident 05/16/2021 1:43 PM

## 2021-05-17 ENCOUNTER — Other Ambulatory Visit: Payer: Self-pay

## 2021-05-18 ENCOUNTER — Other Ambulatory Visit: Payer: Self-pay

## 2021-05-18 ENCOUNTER — Ambulatory Visit: Payer: Self-pay | Attending: Internal Medicine | Admitting: Pharmacist

## 2021-05-18 DIAGNOSIS — E119 Type 2 diabetes mellitus without complications: Secondary | ICD-10-CM

## 2021-05-18 LAB — GLUCOSE, POCT (MANUAL RESULT ENTRY): POC Glucose: 116 mg/dl — AB (ref 70–99)

## 2021-05-18 MED ORDER — METFORMIN HCL ER 500 MG PO TB24
500.0000 mg | ORAL_TABLET | Freq: Every day | ORAL | 2 refills | Status: DC
  Filled 2021-05-18 – 2021-05-26 (×2): qty 30, 30d supply, fill #0
  Filled 2021-06-28 (×2): qty 30, 30d supply, fill #1
  Filled 2021-09-28: qty 30, 30d supply, fill #0

## 2021-05-25 ENCOUNTER — Other Ambulatory Visit: Payer: Self-pay

## 2021-05-26 ENCOUNTER — Other Ambulatory Visit: Payer: Self-pay

## 2021-05-27 ENCOUNTER — Other Ambulatory Visit: Payer: Self-pay

## 2021-06-07 ENCOUNTER — Other Ambulatory Visit: Payer: Self-pay

## 2021-06-14 ENCOUNTER — Other Ambulatory Visit: Payer: Self-pay

## 2021-06-28 ENCOUNTER — Other Ambulatory Visit (HOSPITAL_COMMUNITY): Payer: Self-pay

## 2021-07-08 ENCOUNTER — Other Ambulatory Visit: Payer: Self-pay

## 2021-07-08 NOTE — Progress Notes (Unsigned)
° °  S:    PCP: Dr. Laural Benes  Patient presents for diabetes evaluation, education, and management. Patient was referred and last seen by Primary Care Provider on 04/11/21 and was diagnosed with diabetes and started on low dose metformin though at CPP follow up on 11/2 she hadn't yet picked up the metformin so this was started at that time. At last CPP visit 11/23, BG were at goal and current medications continued.   Today, patient arrives in good spirits. Visit was completed using a Spanish video interpreter. *** -home BG? Decr frequency? -next pcp 08/12/21  Patient reports Diabetes was diagnosed October 2022.   Family/Social History:  -Fhx: noncontributory -Tobacco: Never smoker  [employer] affordability: uninsured  Medication adherence reported.   Current diabetes medications include: metformin 500 mg daily Current hypertension medications include: lisinopril 40 mg daily, chlorthalidone 25 mg daily Current hyperlipidemia medications include: atorvastatin 10 mg daily  Patient denies hypoglycemic events.  Patient reported dietary habits: Reports improving her diet since diagnosis at her daughter's encouragement. Eats lots of vegetables, avoiding red meat, carbs. No dessert, juice, soda.   Patient-reported exercise habits: walks daily for an hour with her granddaughter   Patient reports nocturia (nighttime urination). Once per night.  Patient denies neuropathy (nerve pain). Patient denies visual changes. Patient denies self foot exams. Educated.   O:  POCT BG: 116 (last ate ~4 hours ago)  Lab Results  Component Value Date   HGBA1C 6.9 (H) 04/11/2021   There were no vitals filed for this visit.  Fasting BG (AM): 104, 108, 133, 106, 102, 119 7-9pm BG: 96, 88, 101, 110, 88, 114 7d average 104; 14d average 111  Lipid Panel     Component Value Date/Time   CHOL 191 04/11/2021 1414   TRIG 365 (H) 04/11/2021 1414   HDL 36 (L) 04/11/2021 1414   CHOLHDL 5.3  (H) 04/11/2021 1414   CHOLHDL 4.0 06/03/2014 1043   VLDL 23 06/03/2014 1043   LDLCALC 94 04/11/2021 1414    Clinical Atherosclerotic Cardiovascular Disease (ASCVD): No  The 10-year ASCVD risk score (Arnett DK, et al., 2019) is: 8.7%   Values used to calculate the score:     Age: 61 years     Sex: Female     Is Non-Hispanic African American: No     Diabetic: Yes     Tobacco smoker: No     Systolic Blood Pressure: 110 mmHg     Is BP treated: Yes     HDL Cholesterol: 36 mg/dL     Total Cholesterol: 191 mg/dL    A/P: Diabetes newly diagnosed with recent A1c 6.9 on 04/11/21. Patient is able to verbalize appropriate hypoglycemia management plan. All BG are at goal. Will continue current treatment and follow up again with CPP in January to ensure BG still at goal before visit with PCP in February.  -Continue taking metformin 500 mg daily -Decrease frequency of home BG monitoring to 2-3 times weekly given good control. Bring meter to next visit.  -Extensively discussed pathophysiology of diabetes, recommended lifestyle interventions, dietary effects on blood sugar control  -Counseled on s/sx of and management of hypoglycemia -Next A1C anticipated January 2023.   Total time in face to face counseling 40 minutes. Follow up Pharmacist Clinic Visit in 6 weeks in January. Next PCP visit 08/12/21.    Pervis Hocking, PharmD PGY2 Ambulatory Care Pharmacy Resident 07/08/2021 4:02 PM

## 2021-07-11 ENCOUNTER — Ambulatory Visit: Payer: No Typology Code available for payment source | Attending: Internal Medicine | Admitting: Pharmacist

## 2021-07-15 ENCOUNTER — Other Ambulatory Visit: Payer: Self-pay

## 2021-07-15 ENCOUNTER — Ambulatory Visit: Payer: Self-pay | Attending: Internal Medicine | Admitting: Pharmacist

## 2021-07-15 DIAGNOSIS — E119 Type 2 diabetes mellitus without complications: Secondary | ICD-10-CM

## 2021-07-15 LAB — POCT GLYCOSYLATED HEMOGLOBIN (HGB A1C): HbA1c, POC (controlled diabetic range): 6 % (ref 0.0–7.0)

## 2021-07-15 NOTE — Progress Notes (Signed)
° °  S:    PCP: Dr. Laural Benes  Patient presents for diabetes evaluation, education, and management. Patient was referred and last seen by Primary Care Provider on 10/17/222. We saw her last on 05/18/2021 and made no changes to her medications.   Today, patient arrives in good spirits. Visit was completed using a Spanish video interpreter. She reports that she is continuing to do well since starting metformin. Denies any adverse effects or missed doses. She is checking her BG and brings her meter for review today. Denies any symptoms of hyperglycemia. No hypoglycemic events.   Family/Social History:  -Fhx: noncontributory -Tobacco: Never smoker  Merchant navy officer affordability: uninsured  Medication adherence reported.   Current diabetes medications include: metformin 500 mg daily Current hypertension medications include: lisinopril 40 mg daily, chlorthalidone 25 mg daily Current hyperlipidemia medications include: atorvastatin 10 mg daily  Patient denies hypoglycemic events.  Patient reported dietary habits: Reports improving her diet since diagnosis at her daughter's encouragement. Eats lots of vegetables, avoiding red meat, carbs. No dessert, juice, soda.   Patient-reported exercise habits: walks daily for an hour with her granddaughter   Patient reports nocturia (nighttime urination). Once per night.  Patient denies neuropathy (nerve pain). Patient denies visual changes. Patient denies self foot exams. Educated.   O:  Lab Results  Component Value Date   HGBA1C 6.9 (H) 04/11/2021   There were no vitals filed for this visit.  Lipid Panel     Component Value Date/Time   CHOL 191 04/11/2021 1414   TRIG 365 (H) 04/11/2021 1414   HDL 36 (L) 04/11/2021 1414   CHOLHDL 5.3 (H) 04/11/2021 1414   CHOLHDL 4.0 06/03/2014 1043   VLDL 23 06/03/2014 1043   LDLCALC 94 04/11/2021 1414    Clinical Atherosclerotic Cardiovascular Disease (ASCVD): No  The 10-year ASCVD risk  score (Arnett DK, et al., 2019) is: 8.7%   Values used to calculate the score:     Age: 61 years     Sex: Female     Is Non-Hispanic African American: No     Diabetic: Yes     Tobacco smoker: No     Systolic Blood Pressure: 110 mmHg     Is BP treated: Yes     HDL Cholesterol: 36 mg/dL     Total Cholesterol: 191 mg/dL    A/P: Diabetes diagnosed in Oct 2022 with A1c today of 6.0%. Commended patient! Her diabetes is well-controlled at this time. Patient is able to verbalize appropriate hypoglycemia management plan. Will continue current treatment regimen. -Continue taking metformin 500 mg daily  -Extensively discussed pathophysiology of diabetes, recommended lifestyle interventions, dietary effects on blood sugar control  -Counseled on s/sx of and management of hypoglycemia -POCT A1c  Total time in face to face counseling 30 minutes. Follow up PCP visit 08/12/21.   Butch Penny, PharmD, [place]y [place], CPP Clinical Pharmacist [facility] [phone]

## 2021-08-12 ENCOUNTER — Ambulatory Visit: Payer: Self-pay | Admitting: Internal Medicine

## 2021-08-19 ENCOUNTER — Other Ambulatory Visit: Payer: Self-pay

## 2021-09-28 ENCOUNTER — Other Ambulatory Visit: Payer: Self-pay

## 2021-09-28 ENCOUNTER — Other Ambulatory Visit: Payer: Self-pay | Admitting: Internal Medicine

## 2021-09-28 DIAGNOSIS — E039 Hypothyroidism, unspecified: Secondary | ICD-10-CM

## 2021-09-28 MED ORDER — LEVOTHYROXINE SODIUM 150 MCG PO TABS
150.0000 ug | ORAL_TABLET | Freq: Every day | ORAL | 1 refills | Status: DC
  Filled 2021-09-28: qty 30, 30d supply, fill #0

## 2021-09-29 ENCOUNTER — Other Ambulatory Visit: Payer: Self-pay

## 2021-09-30 ENCOUNTER — Other Ambulatory Visit: Payer: Self-pay

## 2021-10-20 ENCOUNTER — Other Ambulatory Visit: Payer: Self-pay

## 2021-10-25 ENCOUNTER — Ambulatory Visit: Payer: Self-pay | Attending: Internal Medicine | Admitting: Critical Care Medicine

## 2021-10-25 ENCOUNTER — Encounter: Payer: Self-pay | Admitting: Critical Care Medicine

## 2021-10-25 ENCOUNTER — Other Ambulatory Visit: Payer: Self-pay

## 2021-10-25 VITALS — BP 184/95 | HR 53 | Wt 162.2 lb

## 2021-10-25 DIAGNOSIS — E119 Type 2 diabetes mellitus without complications: Secondary | ICD-10-CM

## 2021-10-25 DIAGNOSIS — K219 Gastro-esophageal reflux disease without esophagitis: Secondary | ICD-10-CM

## 2021-10-25 DIAGNOSIS — I10 Essential (primary) hypertension: Secondary | ICD-10-CM

## 2021-10-25 DIAGNOSIS — E785 Hyperlipidemia, unspecified: Secondary | ICD-10-CM

## 2021-10-25 DIAGNOSIS — R1013 Epigastric pain: Secondary | ICD-10-CM

## 2021-10-25 DIAGNOSIS — Z6835 Body mass index (BMI) 35.0-35.9, adult: Secondary | ICD-10-CM

## 2021-10-25 DIAGNOSIS — E039 Hypothyroidism, unspecified: Secondary | ICD-10-CM

## 2021-10-25 LAB — POCT GLYCOSYLATED HEMOGLOBIN (HGB A1C): HbA1c, POC (controlled diabetic range): 6.2 % (ref 0.0–7.0)

## 2021-10-25 LAB — GLUCOSE, POCT (MANUAL RESULT ENTRY): POC Glucose: 92 mg/dl (ref 70–99)

## 2021-10-25 MED ORDER — ATORVASTATIN CALCIUM 10 MG PO TABS
10.0000 mg | ORAL_TABLET | Freq: Every day | ORAL | 3 refills | Status: DC
  Filled 2021-10-25: qty 30, 30d supply, fill #0

## 2021-10-25 MED ORDER — METFORMIN HCL ER 500 MG PO TB24
500.0000 mg | ORAL_TABLET | Freq: Every day | ORAL | 2 refills | Status: DC
  Filled 2021-10-25: qty 30, 30d supply, fill #0

## 2021-10-25 MED ORDER — PANTOPRAZOLE SODIUM 40 MG PO TBEC
40.0000 mg | DELAYED_RELEASE_TABLET | Freq: Every day | ORAL | 3 refills | Status: DC
  Filled 2021-10-25: qty 30, 30d supply, fill #0
  Filled 2021-12-13: qty 30, 30d supply, fill #1
  Filled 2022-01-20: qty 30, 30d supply, fill #2

## 2021-10-25 MED ORDER — LEVOTHYROXINE SODIUM 150 MCG PO TABS
150.0000 ug | ORAL_TABLET | Freq: Every day | ORAL | 1 refills | Status: DC
  Filled 2021-10-25: qty 30, 30d supply, fill #0
  Filled 2021-12-13: qty 30, 30d supply, fill #1

## 2021-10-25 MED ORDER — CHLORTHALIDONE 25 MG PO TABS
ORAL_TABLET | Freq: Every day | ORAL | 5 refills | Status: DC
  Filled 2021-10-25: qty 30, 30d supply, fill #0

## 2021-10-25 MED ORDER — TRUE METRIX BLOOD GLUCOSE TEST VI STRP
ORAL_STRIP | 2 refills | Status: AC
  Filled 2021-10-25: qty 100, fill #0
  Filled 2022-04-26: qty 100, 33d supply, fill #0

## 2021-10-25 MED ORDER — TRUEPLUS LANCETS 28G MISC
2 refills | Status: DC
  Filled 2021-10-25: qty 100, fill #0
  Filled 2022-04-26: qty 100, 33d supply, fill #0

## 2021-10-25 MED ORDER — VALSARTAN 160 MG PO TABS
160.0000 mg | ORAL_TABLET | Freq: Every day | ORAL | 3 refills | Status: DC
  Filled 2021-10-25: qty 30, 30d supply, fill #0
  Filled 2021-12-13: qty 30, 30d supply, fill #1

## 2021-10-25 NOTE — Assessment & Plan Note (Signed)
Hypertension not well controlled she never picked up the chlorthalidone ? ?Plan to change lisinopril to valsartan 160 mg daily and begin chlorthalidone 25 mg daily ? ?We will have patient return in short-term follow-up with [facility] clinical pharmacy ?

## 2021-10-25 NOTE — Assessment & Plan Note (Signed)
Continue with Synthroid 

## 2021-10-25 NOTE — Patient Instructions (Signed)
Stop lisinopril and start valsartan 1 pill daily for blood pressure ? ?Start chlorthalidone 1 pill daily for blood pressure ? ?No other medication changes ? ?All of your previous medicines and these 2 new medications were sent to our pharmacy downstairs ? ?Please get an eye exam sometime this calendar year with your diabetes ? ?Labs today include a urine sample for protein and a chemistry study for your kidney function ? ?Return to see Lurena Joiner in 3 weeks for blood pressure reassessment and see Dr. Wynetta Emery again in 4 months ? ?A lifestyle medicine handout was given to you please focus on the diet and exercise sections of the handout ? ?Suspender lisinopril y comenzar con valsart?n 1 pastilla al d?a para la presi?n arterial ? ?Comience clortalidona 1 pastilla diaria para la presi?n arterial ? ?No hay otros cambios en la medicaci?n ? ?Todos sus [employer] anteriores y estos 2 nuevos medicamentos fueron enviados a nuestra farmacia en la [place]. ? ?H?gase un examen de la vista en alg?n momento de este a?o calendario con su diabetes ? ?Los laboratorios de hoy incluyen [place] para prote?nas y un estudio qu?mico para su funci?n renal. ? ?Volver a ver a Merchant navy officer en 3 semanas para una reevaluaci?n de la presi?n arterial y Location manager a ver al Dr. Wynetta Emery en 4 meses ? ?Se le entreg? un folleto sobre medicamentos para el estilo de vida. Conc?ntrese en las secciones de dieta y ejercicio del folleto. ? ? ?

## 2021-10-25 NOTE — Assessment & Plan Note (Signed)
-  Continue statins ?

## 2021-10-25 NOTE — Assessment & Plan Note (Signed)
Lifestyle medicine handout issued to help patient with weight loss diabetes and hypertension management ?

## 2021-10-25 NOTE — Assessment & Plan Note (Signed)
Diabetes is well controlled at this time we will obtain urine for microalbumin and renal function with GFR continue current medications without change ?

## 2021-10-25 NOTE — Progress Notes (Signed)
? ?Established Patient Office Visit ? ?Subjective   ?Patient ID: Caroline Oneal, female    DOB: Jan 07, 1961  Age: 61 y.o. MRN: [id number] ? ?Chief Complaint  ?Patient presents with  ? Hypertension  ? Medication Refill  ? ? ?This is a 61 year old female here to follow-up primary care visit was assisted by [place] interpreter [place] [phone] on video. ? ?This patient's PCP is Dr. Laural Benes.  Patient has hypertension and diabetes.  A1c today is 6.2 blood sugar 92 blood pressure was elevated however 184/95.  She is supposed to be on chlorthalidone started by clinical pharmacy but she never picked up this prescription due to some miscommunication ? ?The patient is on the lisinopril 40 mg daily.  She does maintain metformin pantoprazole Lipitor.  She is also taking Synthroid 150 mcg daily.  She will need refills on all these medications. ? ?The patient has no real complaints or symptoms at this visit. ? ? ?Patient Active Problem List  ? Diagnosis Date Noted  ? Type 2 diabetes mellitus without complication, without long-term current use of insulin (HCC) 04/13/2021  ? Gastroesophageal reflux disease without esophagitis 11/10/2020  ? Diastasis of rectus abdominis 11/10/2020  ? Hyperlipidemia 11/18/2017  ? Poor dentition 08/04/2015  ? Allergic rhinitis 01/13/2015  ? Hypothyroidism 06/03/2014  ? Class 2 severe obesity due to excess calories with serious comorbidity and body mass index (BMI) of 35.0 to 35.9 in adult [facility])   ? HTN (hypertension) 10/13/2011  ? ?Past Medical History:  ?Diagnosis Date  ? HTN (hypertension) 10/13/2011  ? Thyroid disease Dx 2016  ? Tubal ligation status 10/13/2011  ? Tubal ligation status 10/13/2011  ? ?Past Surgical History:  ?Procedure Laterality Date  ? TUBAL LIGATION    ? ?Social History  ? ?Tobacco Use  ? Smoking status: Never  ? Smokeless tobacco: Never  ?Vaping Use  ? Vaping Use: Never used  ?Substance Use Topics  ? Alcohol use: No  ? Drug use: No  ? ?Social History  ? ?Socioeconomic History  ?  Marital status: Single  ?  Spouse name: Not on file  ? Number of children: 4  ? Years of education: Not on file  ? Highest education level: 6th grade  ?Occupational History  ? Not on file  ?Tobacco Use  ? Smoking status: Never  ? Smokeless tobacco: Never  ?Vaping Use  ? Vaping Use: Never used  ?Substance and Sexual Activity  ? Alcohol use: No  ? Drug use: No  ? Sexual activity: Not Currently  ?  Partners: Male  ?  Birth control/protection: Surgical  ?  Comment: tubal  ?Other Topics Concern  ? Not on file  ?Social History Narrative  ? Not on file  ? ?Social Determinants of Health  ? ?Financial Resource Strain: Not on file  ?Food Insecurity: Food Insecurity Present  ? Worried About Programme researcher, broadcasting/film/video in the Last Year: Often true  ? Ran Out of Food in the Last Year: Often true  ?Transportation Needs: Unmet Transportation Needs  ? Lack of Transportation (Medical): Yes  ? Lack of Transportation (Non-Medical): Yes  ?Physical Activity: Not on file  ?Stress: Not on file  ?Social Connections: Not on file  ?Intimate Partner Violence: Not on file  ? ?Family Status  ?Relation Name Status  ? Mother  Deceased  ? Father  Deceased  ? Neg Hx  (Not Specified)  ? ?Family History  ?Problem Relation Age of Onset  ? Breast cancer Neg Hx   ? ?  Allergies  ?Allergen Reactions  ? Hctz [Hydrochlorothiazide] Other (See Comments)  ?  Chest pain   ? Norvasc [Amlodipine Besylate]   ?  CP and dizziness  ? ?  ? ?Review of Systems  ?Constitutional:  Negative for chills, diaphoresis, fever, malaise/fatigue and weight loss.  ?HENT:  Negative for congestion, hearing loss, nosebleeds, sore throat and tinnitus.   ?Eyes:  Negative for blurred vision, photophobia and redness.  ?Respiratory:  Negative for cough, hemoptysis, sputum production, shortness of breath, wheezing and stridor.   ?Cardiovascular:  Negative for chest pain, palpitations, orthopnea, claudication, leg swelling and PND.  ?Gastrointestinal:  Negative for abdominal pain, blood in stool,  constipation, diarrhea, heartburn, nausea and vomiting.  ?Genitourinary:  Negative for dysuria, flank pain, frequency, hematuria and urgency.  ?Musculoskeletal:  Negative for back pain, falls, joint pain, myalgias and neck pain.  ?Skin:  Negative for itching and rash.  ?Neurological:  Negative for dizziness, tingling, tremors, sensory change, speech change, focal weakness, seizures, loss of consciousness, weakness and headaches.  ?Endo/Heme/Allergies:  Negative for environmental allergies and polydipsia. Does not bruise/bleed easily.  ?Psychiatric/Behavioral:  Negative for depression, memory loss, substance abuse and suicidal ideas. The patient is not nervous/anxious and does not have insomnia.   ? ?  ?Objective:  ?  ? ?BP (!) 184/95   Pulse (!) 53   Wt 162 lb 3.2 oz (73.6 kg)   SpO2 99%   BMI 31.68 kg/m?  ?BP Readings from Last 3 Encounters:  ?10/25/21 (!) 184/95  ?04/11/21 110/73  ?02/22/21 140/78  ? ?Wt Readings from Last 3 Encounters:  ?10/25/21 162 lb 3.2 oz (73.6 kg)  ?04/11/21 180 lb (81.6 kg)  ?02/22/21 190 lb (86.2 kg)  ? ?  ? ?Physical Exam ? ? ?Results for orders placed or performed in visit on 10/25/21  ?POCT glucose (manual entry)  ?Result Value Ref Range  ? POC Glucose 92 70 - 99 mg/dl  ?POCT glycosylated hemoglobin (Hb A1C)  ?Result Value Ref Range  ? Hemoglobin A1C    ? HbA1c POC (<> result, manual entry)    ? HbA1c, POC (prediabetic range)    ? HbA1c, POC (controlled diabetic range) 6.2 0.0 - 7.0 %  ? ? ?Last CBC ?Lab Results  ?Component Value Date  ? WBC 8.6 10/15/2020  ? HGB 15.9 (H) 10/15/2020  ? HCT 46.3 (H) 10/15/2020  ? MCV 92.2 10/15/2020  ? MCH 31.7 10/15/2020  ? RDW 12.7 10/15/2020  ? PLT 341 10/15/2020  ? ?Last metabolic panel ?Lab Results  ?Component Value Date  ? GLUCOSE 112 (H) 10/15/2020  ? NA 137 10/15/2020  ? K 3.8 10/15/2020  ? CL 102 10/15/2020  ? CO2 26 10/15/2020  ? BUN 15 10/15/2020  ? CREATININE 0.80 10/15/2020  ? GFRNONAA >60 10/15/2020  ? CALCIUM 10.0 10/15/2020  ? PROT  8.1 10/15/2020  ? ALBUMIN 4.4 10/15/2020  ? LABGLOB 2.5 06/12/2019  ? AGRATIO 1.7 06/12/2019  ? BILITOT 0.9 10/15/2020  ? ALKPHOS 75 10/15/2020  ? AST 26 10/15/2020  ? ALT 23 10/15/2020  ? ANIONGAP 9 10/15/2020  ? ?Last lipids ?Lab Results  ?Component Value Date  ? CHOL 191 04/11/2021  ? HDL 36 (L) 04/11/2021  ? LDLCALC 94 04/11/2021  ? TRIG 365 (H) 04/11/2021  ? CHOLHDL 5.3 (H) 04/11/2021  ? ?Last hemoglobin A1c ?Lab Results  ?Component Value Date  ? HGBA1C 6.2 10/25/2021  ? ?Last thyroid functions ?Lab Results  ?Component Value Date  ? TSH 0.056 (L) 04/11/2021  ?  T3TOTAL 104.5 06/03/2014  ? T4TOTAL 7.5 07/22/2020  ? ?Last vitamin D ?No results found for: 25OHVITD2, 25OHVITD3, VD25OH ?Last vitamin B12 and Folate ?Lab Results  ?Component Value Date  ? LFYBOFBP10 385 07/22/2014  ? ?  ? ?The 10-year ASCVD risk score (Arnett DK, et al., 2019) is: 24.4% ? ?  ?Assessment & Plan:  ? ?Problem List Items Addressed This Visit   ? ?  ? Cardiovascular and Mediastinum  ? HTN (hypertension) (Chronic)  ?  Hypertension not well controlled she never picked up the chlorthalidone ? ?Plan to change lisinopril to valsartan 160 mg daily and begin chlorthalidone 25 mg daily ? ?We will have patient return in short-term follow-up with [facility] clinical pharmacy ? ?  ?  ? Relevant Medications  ? atorvastatin (LIPITOR) 10 MG tablet  ? chlorthalidone (HYGROTON) 25 MG tablet  ? valsartan (DIOVAN) 160 MG tablet  ?  ? Digestive  ? Gastroesophageal reflux disease without esophagitis  ? Relevant Medications  ? pantoprazole (PROTONIX) 40 MG tablet  ?  ? Endocrine  ? Hypothyroidism (Chronic)  ?  Continue with Synthroid ? ?  ?  ? Relevant Medications  ? levothyroxine (SYNTHROID) 150 MCG tablet  ? Type 2 diabetes mellitus without complication, without long-term current use of insulin (HCC) - Primary  ?  Diabetes is well controlled at this time we will obtain urine for microalbumin and renal function with GFR continue current medications without change ? ?   ?  ? Relevant Medications  ? atorvastatin (LIPITOR) 10 MG tablet  ? metFORMIN (GLUCOPHAGE-XR) 500 MG 24 hr tablet  ? TRUEplus Lancets 28G MISC  ? glucose blood (TRUE METRIX BLOOD GLUCOSE TEST) test strip

## 2021-10-26 LAB — MICROALBUMIN / CREATININE URINE RATIO
Creatinine, Urine: 20.3 mg/dL
Microalb/Creat Ratio: 37 mg/g creat — ABNORMAL HIGH (ref 0–29)
Microalbumin, Urine: 7.6 ug/mL

## 2021-10-26 LAB — RENAL FUNCTION PANEL
Albumin: 4.8 g/dL (ref 3.8–4.8)
BUN/Creatinine Ratio: 19 (ref 12–28)
BUN: 12 mg/dL (ref 8–27)
CO2: 24 mmol/L (ref 20–29)
Calcium: 10.1 mg/dL (ref 8.7–10.3)
Chloride: 103 mmol/L (ref 96–106)
Creatinine, Ser: 0.62 mg/dL (ref 0.57–1.00)
Glucose: 96 mg/dL (ref 70–99)
Phosphorus: 3.8 mg/dL (ref 3.0–4.3)
Potassium: 3.9 mmol/L (ref 3.5–5.2)
Sodium: 141 mmol/L (ref 134–144)
eGFR: 101 mL/min/{1.73_m2} (ref >59)

## 2021-10-28 ENCOUNTER — Encounter: Payer: Self-pay | Admitting: *Deleted

## 2021-11-28 ENCOUNTER — Ambulatory Visit: Payer: No Typology Code available for payment source | Admitting: Pharmacist

## 2021-12-13 ENCOUNTER — Other Ambulatory Visit: Payer: Self-pay

## 2021-12-14 ENCOUNTER — Other Ambulatory Visit: Payer: Self-pay

## 2022-01-20 ENCOUNTER — Other Ambulatory Visit: Payer: Self-pay

## 2022-01-20 ENCOUNTER — Other Ambulatory Visit: Payer: Self-pay | Admitting: Critical Care Medicine

## 2022-01-20 DIAGNOSIS — E039 Hypothyroidism, unspecified: Secondary | ICD-10-CM

## 2022-01-20 MED ORDER — LEVOTHYROXINE SODIUM 150 MCG PO TABS
150.0000 ug | ORAL_TABLET | Freq: Every day | ORAL | 1 refills | Status: DC
  Filled 2022-01-20: qty 30, 30d supply, fill #0

## 2022-01-23 ENCOUNTER — Other Ambulatory Visit: Payer: Self-pay

## 2022-01-23 ENCOUNTER — Other Ambulatory Visit: Payer: Self-pay | Admitting: Pharmacist

## 2022-01-23 DIAGNOSIS — R1013 Epigastric pain: Secondary | ICD-10-CM

## 2022-01-23 DIAGNOSIS — K219 Gastro-esophageal reflux disease without esophagitis: Secondary | ICD-10-CM

## 2022-01-23 MED ORDER — PANTOPRAZOLE SODIUM 40 MG PO TBEC: 40.0000 mg | DELAYED_RELEASE_TABLET | Freq: Every day | ORAL | 3 refills | Status: DC

## 2022-02-28 ENCOUNTER — Other Ambulatory Visit: Payer: Self-pay

## 2022-02-28 ENCOUNTER — Encounter: Payer: Self-pay | Admitting: Internal Medicine

## 2022-02-28 ENCOUNTER — Ambulatory Visit: Payer: Self-pay | Attending: Internal Medicine | Admitting: Internal Medicine

## 2022-02-28 VITALS — BP 174/79 | HR 56 | Ht 60.0 in | Wt 166.2 lb

## 2022-02-28 DIAGNOSIS — Z1211 Encounter for screening for malignant neoplasm of colon: Secondary | ICD-10-CM

## 2022-02-28 DIAGNOSIS — I152 Hypertension secondary to endocrine disorders: Secondary | ICD-10-CM

## 2022-02-28 DIAGNOSIS — K219 Gastro-esophageal reflux disease without esophagitis: Secondary | ICD-10-CM

## 2022-02-28 DIAGNOSIS — E1159 Type 2 diabetes mellitus with other circulatory complications: Secondary | ICD-10-CM

## 2022-02-28 DIAGNOSIS — Z23 Encounter for immunization: Secondary | ICD-10-CM

## 2022-02-28 DIAGNOSIS — Z6832 Body mass index (BMI) 32.0-32.9, adult: Secondary | ICD-10-CM

## 2022-02-28 DIAGNOSIS — E1169 Type 2 diabetes mellitus with other specified complication: Secondary | ICD-10-CM

## 2022-02-28 DIAGNOSIS — E039 Hypothyroidism, unspecified: Secondary | ICD-10-CM

## 2022-02-28 DIAGNOSIS — F5101 Primary insomnia: Secondary | ICD-10-CM

## 2022-02-28 DIAGNOSIS — E785 Hyperlipidemia, unspecified: Secondary | ICD-10-CM

## 2022-02-28 DIAGNOSIS — E669 Obesity, unspecified: Secondary | ICD-10-CM

## 2022-02-28 MED ORDER — VALSARTAN 320 MG PO TABS
320.0000 mg | ORAL_TABLET | Freq: Every day | ORAL | 5 refills | Status: DC
  Filled 2022-02-28: qty 30, 30d supply, fill #0
  Filled 2022-04-24 (×2): qty 30, 30d supply, fill #1

## 2022-02-28 MED ORDER — PANTOPRAZOLE SODIUM 40 MG PO TBEC
40.0000 mg | DELAYED_RELEASE_TABLET | Freq: Every day | ORAL | 3 refills | Status: DC | PRN
  Filled 2022-02-28: qty 30, 30d supply, fill #0
  Filled 2022-04-24 (×2): qty 30, 30d supply, fill #1

## 2022-02-28 MED ORDER — LEVOTHYROXINE SODIUM 150 MCG PO TABS
150.0000 ug | ORAL_TABLET | Freq: Every day | ORAL | 1 refills | Status: DC
  Filled 2022-02-28: qty 30, 30d supply, fill #0
  Filled 2022-04-24: qty 30, 30d supply, fill #1

## 2022-02-28 MED ORDER — CHLORTHALIDONE 25 MG PO TABS
ORAL_TABLET | Freq: Every day | ORAL | 5 refills | Status: DC
  Filled 2022-02-28: qty 30, 30d supply, fill #0

## 2022-02-28 MED ORDER — ATORVASTATIN CALCIUM 10 MG PO TABS
10.0000 mg | ORAL_TABLET | Freq: Every day | ORAL | 3 refills | Status: DC
  Filled 2022-02-28: qty 30, 30d supply, fill #0

## 2022-02-28 NOTE — Progress Notes (Signed)
Patient ID: Caroline Oneal, female    DOB: 1961-06-08  MRN: [id number]  CC: Hypertension   Subjective: Caroline Oneal is a 61 y.o. female who presents for chronic ds management Her concerns today include:  HTN, HL, hypothyroidism, DM, obesity.   HTN:  Lisinopril changed to Valsartan by Dr. Joya Gaskins on last visit with [place] 10/2021.  Has bottles of Protonix, Levothyroxine 150 mcg and Chlorthalidone.  Does not have Valsartan with her but reports she has it at home and is taking.  Bottles she brought in today are in need of RF. No device to check BP.   Reports compliance with taking Levothyroxine 150 mcg daily.  Request RF.  Last TSH check 03/2021. Request RF on Protonix but does not feel she needs to take it every day.  DM: Lab Results  Component Value Date   HGBA1C 6.2 10/25/2021  -checks BS once a wk.  Gives range 100-112 -Reports she has changed her eating habits; eating healthier.  Goes to gym x 3 a wk Walks on treadmill and uses stationary bicycle According to our scale she is down 14 pounds since October of last year.  Not sleeping well for past 1 mth.   Gets in bed around 10-10:30 p.m.  She gets in bed when she feels tired/sleepy but once in bed "the tiredness goes a way."  Sleeps with TV on sometimes.  Drinks only milk and water in the evenings.    HM: agrees to get flu shot.   Patient Active Problem List   Diagnosis Date Noted   Type 2 diabetes mellitus without complication, without long-term current use of insulin ([place]) 04/13/2021   Gastroesophageal reflux disease without esophagitis 11/10/2020   Diastasis of rectus abdominis 11/10/2020   Hyperlipidemia 11/18/2017   Poor dentition 08/04/2015   Allergic rhinitis 01/13/2015   Hypothyroidism 06/03/2014   Class 2 severe obesity due to excess calories with serious comorbidity and body mass index (BMI) of 35.0 to 35.9 in adult [facility])    HTN (hypertension) 10/13/2011     Current Outpatient Medications on File  Prior to Visit  Medication Sig Dispense Refill   Blood Glucose Monitoring Suppl (TRUE METRIX METER) w/Device KIT Use to check blood sugar 2-3 times weekly. 1 kit 0   glucose blood (TRUE METRIX BLOOD GLUCOSE TEST) test strip Use to check blood sugar 2-3 times weekly. 100 each 2   metFORMIN (GLUCOPHAGE-XR) 500 MG 24 hr tablet Take 1 tablet (500 mg total) by mouth daily with breakfast. 30 tablet 2   Multiple Vitamin (MULTIVITAMIN WITH MINERALS) TABS tablet Take 1 tablet by mouth daily.     TRUEplus Lancets 28G MISC Use to check blood sugar 2-3 times weekly. 100 each 2   No current facility-administered medications on file prior to visit.    Allergies  Allergen Reactions   Hctz [Hydrochlorothiazide] Other (See Comments)    Chest pain    Norvasc [Amlodipine Besylate]     CP and dizziness    Social History   Socioeconomic History   Marital status: Single    Spouse name: Not on file   Number of children: 4   Years of education: Not on file   Highest education level: 6th grade  Occupational History   Not on file  Tobacco Use   Smoking status: Never   Smokeless tobacco: Never  Vaping Use   Vaping Use: Never used  Substance and Sexual Activity   Alcohol use: No   Drug use: No  Sexual activity: Not Currently    Partners: Male    Birth control/protection: Surgical    Comment: tubal  Other Topics Concern   Not on file  Social History Narrative   Not on file   Social Determinants of Health   Financial Resource Strain: Not on file  Food Insecurity: Food Insecurity Present (02/22/2021)   Hunger Vital Sign    Worried About Running Out of Food in the Last Year: Often true    Ran Out of Food in the Last Year: Often true  Transportation Needs: Unmet Transportation Needs (02/22/2021)   PRAPARE - Hydrologist (Medical): Yes    Lack of Transportation (Non-Medical): Yes  Physical Activity: Not on file  Stress: Not on file  Social Connections: Not on file   Intimate Partner Violence: Not on file    Family History  Problem Relation Age of Onset   Breast cancer Neg Hx     Past Surgical History:  Procedure Laterality Date   TUBAL LIGATION      ROS: Review of Systems Negative except as stated above  PHYSICAL EXAM: BP (!) 174/79   Pulse (!) 56   Ht 5' (1.524 m)   Wt 166 lb 3.2 oz (75.4 kg)   SpO2 97%   BMI 32.46 kg/m   Wt Readings from Last 3 Encounters:  02/28/22 166 lb 3.2 oz (75.4 kg)  10/25/21 162 lb 3.2 oz (73.6 kg)  04/11/21 180 lb (81.6 kg)    Physical Exam  General appearance - alert, well appearing, and in no distress Mental status - normal mood, behavior, speech, dress, motor activity, and thought processes Neck - supple, no significant adenopathy Chest - clear to auscultation, no wheezes, rales or rhonchi, symmetric air entry Heart - normal rate, regular rhythm, normal S1, S2, no murmurs, rubs, clicks or gallops Extremities - peripheral pulses normal, no pedal edema, no clubbing or cyanosis      Latest Ref Rng & Units 10/25/2021    3:46 PM 10/15/2020    4:44 PM 07/22/2020    3:19 PM  CMP  Glucose 70 - 99 mg/dL 96  112  141   BUN 8 - 27 mg/dL '12  15  19   ' Creatinine 0.57 - 1.00 mg/dL 0.62  0.80  0.91   Sodium 134 - 144 mmol/L 141  137  141   Potassium 3.5 - 5.2 mmol/L 3.9  3.8  3.5   Chloride 96 - 106 mmol/L 103  102  101   CO2 20 - 29 mmol/L '24  26  22   ' Calcium 8.7 - 10.3 mg/dL 10.1  10.0  9.9   Total Protein 6.5 - 8.1 g/dL  8.1    Total Bilirubin 0.3 - 1.2 mg/dL  0.9    Alkaline Phos 38 - 126 U/L  75    AST 15 - 41 U/L  26    ALT 0 - 44 U/L  23     Lipid Panel     Component Value Date/Time   CHOL 191 04/11/2021 1414   TRIG 365 (H) 04/11/2021 1414   HDL 36 (L) 04/11/2021 1414   CHOLHDL 5.3 (H) 04/11/2021 1414   CHOLHDL 4.0 06/03/2014 1043   VLDL 23 06/03/2014 1043   LDLCALC 94 04/11/2021 1414    CBC    Component Value Date/Time   WBC 8.6 10/15/2020 1644   RBC 5.02 10/15/2020 1644   HGB  15.9 (H) 10/15/2020 1644   HGB 14.4  06/12/2019 1002   HCT 46.3 (H) 10/15/2020 1644   HCT 43.4 06/12/2019 1002   PLT 341 10/15/2020 1644   PLT 308 06/12/2019 1002   MCV 92.2 10/15/2020 1644   MCV 92 06/12/2019 1002   MCH 31.7 10/15/2020 1644   MCHC 34.3 10/15/2020 1644   RDW 12.7 10/15/2020 1644   RDW 13.5 06/12/2019 1002   LYMPHSABS 2.7 10/15/2020 1644   MONOABS 0.9 10/15/2020 1644   EOSABS 0.5 10/15/2020 1644   BASOSABS 0.1 10/15/2020 1644    ASSESSMENT AND PLAN: 1. Diabetes mellitus type 2 in obese (HCC) Last A1c at goal.  Commended her on changing her eating habits.  Encouraged her to keep up the good work.  Encouraged her to continue regular exercise.  Continue metformin. - CBC - Comprehensive metabolic panel - Hemoglobin A1c  2. Hypertension associated with type 2 diabetes mellitus ([place]) Not at goal Continue chlorthalidone.  Increase valsartan to 320 mg daily Follow-up with clinical pharmacist in 2 weeks for repeat blood pressure check. - chlorthalidone (HYGROTON) 25 MG tablet; TAKE 1 TABLET (25 MG TOTAL) BY MOUTH DAILY.  Dispense: 30 tablet; Refill: 5  3. Acquired hypothyroidism - levothyroxine (SYNTHROID) 150 MCG tablet; Take 1 tablet (150 mcg total) by mouth daily before breakfast.  Dispense: 30 tablet; Refill: 1  4. Primary insomnia Good sleep hygiene discussed and encouraged. Patient advised not to drink any caffeinated beverages or excessive alcohol use within several hours of bedtime.  Advised to get in bed around about the same time every night.  Once in bed, turn off all lights and sounds.  If unable to fall asleep within 30 to 45 minutes of getting in bed, patient should get up and try to do something until she feels sleepy again.  At that time try getting back in bed. -Can try melatonin 3 mg over-the-counter  5. Hyperlipidemia associated with type 2 diabetes mellitus (HCC) Continue atorvastatin - Lipid panel  6. Gastroesophageal reflux disease without  esophagitis Change Protonix to as needed.  GERD precautions discussed and encouraged. - pantoprazole (PROTONIX) 40 MG tablet; Take 1 tablet (40 mg total) by mouth daily as needed.  Dispense: 30 tablet; Refill: 3  7. Screening for colon cancer - Fecal occult blood, imunochemical([facility])  8. Need for immunization against influenza - Flu Vaccine QUAD 54moIM (Fluarix, Fluzone & Alfiuria Quad PF)   Addendum 03/01/2022: I forgot to add TSH level for thyroid check.  We will see if it can be added to labs that have already been drawn. [employer] Language interpreter used during this encounter. ##[id number] Emmanuel  Patient was given the opportunity to ask questions.  Patient verbalized understanding of the plan and was able to repeat key elements of the plan.   This documentation was completed using DRadio producer  Any transcriptional errors are unintentional.  Orders Placed This Encounter  Procedures   Fecal occult blood, imunochemical([facility])   Flu Vaccine QUAD 641moM (Fluarix, Fluzone & Alfiuria Quad PF)   CBC   Lipid panel   Comprehensive metabolic panel   Hemoglobin A1c     Requested Prescriptions   Signed Prescriptions Disp Refills   atorvastatin (LIPITOR) 10 MG tablet 30 tablet 3    Sig: Take 1 tablet (10 mg total) by mouth daily.   levothyroxine (SYNTHROID) 150 MCG tablet 30 tablet 1    Sig: Take 1 tablet (150 mcg total) by mouth daily before breakfast.   valsartan (DIOVAN) 320 MG tablet 30 tablet 5    Sig: Take  1 tablet (320 mg total) by mouth daily.   pantoprazole (PROTONIX) 40 MG tablet 30 tablet 3    Sig: Take 1 tablet (40 mg total) by mouth daily as needed.   chlorthalidone (HYGROTON) 25 MG tablet 30 tablet 5    Sig: TAKE 1 TABLET (25 MG TOTAL) BY MOUTH DAILY.    Return in about 4 months (around 06/30/2022) for Appt with [facility] in 2 wks for BP check.  Karle Plumber, MD, FACP

## 2022-03-01 ENCOUNTER — Other Ambulatory Visit: Payer: Self-pay

## 2022-03-01 ENCOUNTER — Other Ambulatory Visit: Payer: Self-pay | Admitting: Internal Medicine

## 2022-03-01 LAB — CBC
Hematocrit: 43.9 % (ref 34.0–46.6)
Hemoglobin: 14.2 g/dL (ref 11.1–15.9)
MCH: 30.3 pg (ref 26.6–33.0)
MCHC: 32.3 g/dL (ref 31.5–35.7)
MCV: 94 fL (ref 79–97)
Platelets: 300 10*3/uL (ref 150–450)
RBC: 4.69 x10E6/uL (ref 3.77–5.28)
RDW: 13.3 % (ref 11.7–15.4)
WBC: 6.5 10*3/uL (ref 3.4–10.8)

## 2022-03-01 LAB — COMPREHENSIVE METABOLIC PANEL
ALT: 28 IU/L (ref 0–32)
AST: 19 IU/L (ref 0–40)
Albumin/Globulin Ratio: 1.7 (ref 1.2–2.2)
Albumin: 4.4 g/dL (ref 3.9–4.9)
Alkaline Phosphatase: 104 IU/L (ref 44–121)
BUN/Creatinine Ratio: 24 (ref 12–28)
BUN: 14 mg/dL (ref 8–27)
Bilirubin Total: 0.2 mg/dL (ref 0.0–1.2)
CO2: 25 mmol/L (ref 20–29)
Calcium: 9.7 mg/dL (ref 8.7–10.3)
Chloride: 106 mmol/L (ref 96–106)
Creatinine, Ser: 0.59 mg/dL (ref 0.57–1.00)
Globulin, Total: 2.6 g/dL (ref 1.5–4.5)
Glucose: 110 mg/dL — ABNORMAL HIGH (ref 70–99)
Potassium: 4 mmol/L (ref 3.5–5.2)
Sodium: 141 mmol/L (ref 134–144)
Total Protein: 7 g/dL (ref 6.0–8.5)
eGFR: 102 mL/min/{1.73_m2} (ref >59)

## 2022-03-01 LAB — LIPID PANEL
Chol/HDL Ratio: 4.1 ratio (ref 0.0–4.4)
Cholesterol, Total: 200 mg/dL — ABNORMAL HIGH (ref 100–199)
HDL: 49 mg/dL (ref >39)
LDL Chol Calc (NIH): 91 mg/dL (ref 0–99)
Triglycerides: 362 mg/dL — ABNORMAL HIGH (ref 0–149)
VLDL Cholesterol Cal: 60 mg/dL — ABNORMAL HIGH (ref 5–40)

## 2022-03-01 LAB — HEMOGLOBIN A1C
Est. average glucose Bld gHb Est-mCnc: 128 mg/dL
Hgb A1c MFr Bld: 6.1 % — ABNORMAL HIGH (ref 4.8–5.6)

## 2022-03-01 MED ORDER — ATORVASTATIN CALCIUM 20 MG PO TABS
20.0000 mg | ORAL_TABLET | Freq: Every day | ORAL | 5 refills | Status: DC
  Filled 2022-03-01: qty 30, 30d supply, fill #0

## 2022-03-01 NOTE — Addendum Note (Signed)
Addended by: Jonah Blue B on: 03/01/2022 07:06 AM   Modules accepted: Orders

## 2022-03-02 LAB — SPECIMEN STATUS REPORT

## 2022-03-02 LAB — TSH: TSH: 3.98 u[IU]/mL (ref 0.450–4.500)

## 2022-03-07 ENCOUNTER — Other Ambulatory Visit: Payer: Self-pay

## 2022-03-24 ENCOUNTER — Other Ambulatory Visit: Payer: Self-pay

## 2022-03-28 ENCOUNTER — Other Ambulatory Visit: Payer: Self-pay

## 2022-04-24 ENCOUNTER — Other Ambulatory Visit: Payer: Self-pay

## 2022-04-26 ENCOUNTER — Other Ambulatory Visit: Payer: Self-pay

## 2022-08-10 ENCOUNTER — Ambulatory Visit: Payer: Self-pay | Admitting: Internal Medicine

## 2022-09-12 ENCOUNTER — Ambulatory Visit: Payer: Self-pay | Attending: Internal Medicine | Admitting: Internal Medicine

## 2023-06-13 ENCOUNTER — Ambulatory Visit: Payer: Self-pay | Admitting: Physician Assistant

## 2023-06-13 ENCOUNTER — Other Ambulatory Visit: Payer: Self-pay

## 2023-06-13 VITALS — BP 140/80 | HR 66 | Wt 182.0 lb

## 2023-06-13 DIAGNOSIS — E785 Hyperlipidemia, unspecified: Secondary | ICD-10-CM

## 2023-06-13 DIAGNOSIS — E119 Type 2 diabetes mellitus without complications: Secondary | ICD-10-CM

## 2023-06-13 DIAGNOSIS — I10 Essential (primary) hypertension: Secondary | ICD-10-CM

## 2023-06-13 DIAGNOSIS — K219 Gastro-esophageal reflux disease without esophagitis: Secondary | ICD-10-CM

## 2023-06-13 DIAGNOSIS — E039 Hypothyroidism, unspecified: Secondary | ICD-10-CM

## 2023-06-13 DIAGNOSIS — Z1231 Encounter for screening mammogram for malignant neoplasm of breast: Secondary | ICD-10-CM

## 2023-06-13 DIAGNOSIS — M25511 Pain in right shoulder: Secondary | ICD-10-CM

## 2023-06-13 LAB — POCT GLYCOSYLATED HEMOGLOBIN (HGB A1C): Hemoglobin A1C: 6.5 % — AB (ref 4.0–5.6)

## 2023-06-13 MED ORDER — LEVOTHYROXINE SODIUM 175 MCG PO TABS
175.0000 ug | ORAL_TABLET | Freq: Every day | ORAL | 1 refills | Status: DC
  Filled 2023-06-13: qty 30, 30d supply, fill #0

## 2023-06-13 MED ORDER — ATORVASTATIN CALCIUM 40 MG PO TABS
40.0000 mg | ORAL_TABLET | Freq: Every day | ORAL | 1 refills | Status: DC
  Filled 2023-06-13: qty 30, 30d supply, fill #0

## 2023-06-13 MED ORDER — CHLORTHALIDONE 25 MG PO TABS
ORAL_TABLET | Freq: Every day | ORAL | 1 refills | Status: DC
  Filled 2023-06-13: qty 30, 30d supply, fill #0

## 2023-06-13 MED ORDER — OMEPRAZOLE 20 MG PO CPDR
20.0000 mg | DELAYED_RELEASE_CAPSULE | Freq: Every day | ORAL | 1 refills | Status: DC
  Filled 2023-06-13: qty 30, 30d supply, fill #0

## 2023-06-13 MED ORDER — LOSARTAN POTASSIUM 50 MG PO TABS
50.0000 mg | ORAL_TABLET | Freq: Every day | ORAL | 1 refills | Status: DC
  Filled 2023-06-13: qty 30, 30d supply, fill #0

## 2023-06-13 MED ORDER — METFORMIN HCL ER 500 MG PO TB24
500.0000 mg | ORAL_TABLET | Freq: Every day | ORAL | 2 refills | Status: DC
  Filled 2023-06-13: qty 30, 30d supply, fill #0

## 2023-06-13 NOTE — Progress Notes (Signed)
Established Patient Office Visit  Subjective   Patient ID: Caroline Oneal, female    DOB: 1961/01/18  Age: 62 y.o. MRN: [id number]  Chief Complaint  Patient presents with   Establish Care    Pt needs Mammogram    Shoulder Pain    Right shoulder pain, Patient fell a few weeks ago. Pain radiates from her shoulder down to her wrist.     Discussed the use of AI scribe software for clinical note transcription with the patient, who gave verbal consent to proceed.  History of Present Illness  The patient, with a history of diabetes, hypertension, and thyroid disease, presents with right shoulder pain following a fall a couple of weeks ago. The fall was caused by tripping over a dog, and the patient landed on the right arm. The patient did not seek immediate medical attention due to financial concerns. The pain is significant, limiting the patient's ability to lift the arm. Over-the-counter Tylenol has been used for pain management, but it provides minimal relief.  Denies numbness or tingling.  The patient also has a history of medication non-adherence due to financial constraints, having gone a year without medications. However, the patient recently resumed taking losartan 50mg , atorvastatin 40mg , chlorthalidone, levothyroxine , and metformin 500mg  once daily. The patient also reports a need for a mammogram.  Daughter is present and provides interpretation, declines clinic interpreter        Past Medical History:  Diagnosis Date   HTN (hypertension) 10/13/2011   Thyroid disease Dx 2016   Tubal ligation status 10/13/2011   Tubal ligation status 10/13/2011   Social History   Socioeconomic History   Marital status: Single    Spouse name: Not on file   Number of children: 4   Years of education: Not on file   Highest education level: 6th grade  Occupational History   Not on file  Tobacco Use   Smoking status: Never   Smokeless tobacco: Never  Vaping Use   Vaping  status: Never Used  Substance and Sexual Activity   Alcohol use: No   Drug use: No   Sexual activity: Not Currently    Partners: Male    Birth control/protection: Surgical    Comment: tubal  Other Topics Concern   Not on file  Social History Narrative   Not on file   Social Drivers of Health   Financial Resource Strain: Not on file  Food Insecurity: Food Insecurity Present (02/22/2021)   Hunger Vital Sign    Worried About Running Out of Food in the Last Year: Often true    Ran Out of Food in the Last Year: Often true  Transportation Needs: Unmet Transportation Needs (02/22/2021)   PRAPARE - Administrator, Civil Service (Medical): Yes    Lack of Transportation (Non-Medical): Yes  Physical Activity: Not on file  Stress: Not on file  Social Connections: Not on file  Intimate Partner Violence: Not on file   Family History  Problem Relation Age of Onset   Breast cancer Neg Hx    Allergies  Allergen Reactions   Hctz [Hydrochlorothiazide] Other (See Comments)    Chest pain    Norvasc [Amlodipine Besylate]     CP and dizziness    Review of Systems  Constitutional: Negative.   HENT: Negative.    Eyes: Negative.   Respiratory:  Negative for shortness of breath.   Cardiovascular:  Negative for chest pain.  Gastrointestinal: Negative.   Genitourinary: Negative.  Musculoskeletal:  Positive for joint pain.  Skin: Negative.   Neurological: Negative.   Endo/Heme/Allergies: Negative.   Psychiatric/Behavioral: Negative.        Objective:     BP (!) 140/80 (BP Location: Left Arm, Patient Position: Sitting, Cuff Size: Large)   Pulse 66   Wt 182 lb (82.6 kg)   BMI 35.54 kg/m  BP Readings from Last 3 Encounters:  06/13/23 (!) 140/80  02/28/22 (!) 174/79  10/25/21 (!) 184/95   Wt Readings from Last 3 Encounters:  06/13/23 182 lb (82.6 kg)  02/28/22 166 lb 3.2 oz (75.4 kg)  10/25/21 162 lb 3.2 oz (73.6 kg)    Physical Exam Vitals and nursing note  reviewed.  Constitutional:      Appearance: Normal appearance.  HENT:     Head: Normocephalic and atraumatic.     Right Ear: External ear normal.     Left Ear: External ear normal.     Nose: Nose normal.     Mouth/Throat:     Mouth: Mucous membranes are moist.     Pharynx: Oropharynx is clear.  Eyes:     Extraocular Movements: Extraocular movements intact.     Conjunctiva/sclera: Conjunctivae normal.     Pupils: Pupils are equal, round, and reactive to light.  Cardiovascular:     Rate and Rhythm: Normal rate and regular rhythm.     Pulses: Normal pulses.     Heart sounds: Normal heart sounds.  Pulmonary:     Effort: Pulmonary effort is normal.     Breath sounds: Normal breath sounds.  Musculoskeletal:        General: Normal range of motion.     Right shoulder: Tenderness present. No swelling. Decreased strength. Normal pulse.     Left shoulder: Normal.     Cervical back: Normal range of motion and neck supple.  Skin:    General: Skin is warm and dry.  Neurological:     General: No focal deficit present.     Mental Status: She is alert and oriented to person, place, and time.  Psychiatric:        Mood and Affect: Mood normal.        Behavior: Behavior normal.        Thought Content: Thought content normal.        Judgment: Judgment normal.        Assessment & Plan:   Problem List Items Addressed This Visit       Cardiovascular and Mediastinum   HTN (hypertension) (Chronic)   Relevant Medications   losartan (COZAAR) 50 MG tablet   chlorthalidone (HYGROTON) 25 MG tablet   atorvastatin (LIPITOR) 40 MG tablet   Other Relevant Orders   CBC with Differential/Platelet   Comp. Metabolic Panel (12)     Digestive   Gastroesophageal reflux disease without esophagitis   Relevant Medications   omeprazole (PRILOSEC) 20 MG capsule     Endocrine   Hypothyroidism (Chronic)   Relevant Medications   levothyroxine (SYNTHROID) 175 MCG tablet   Other Relevant Orders    Thyroid Panel With TSH   Type 2 diabetes mellitus without complication, without long-term current use of insulin (HCC)   Relevant Medications   losartan (COZAAR) 50 MG tablet   metFORMIN (GLUCOPHAGE-XR) 500 MG 24 hr tablet   atorvastatin (LIPITOR) 40 MG tablet   Other Relevant Orders   Microalbumin / creatinine urine ratio     Other   Hyperlipidemia   Relevant Medications   losartan (COZAAR)  50 MG tablet   chlorthalidone (HYGROTON) 25 MG tablet   atorvastatin (LIPITOR) 40 MG tablet   Other Visit Diagnoses       Acute pain of right shoulder    -  Primary   Relevant Orders   DG Shoulder Right     Encounter for screening mammogram for malignant neoplasm of breast       Relevant Orders   MM DIGITAL SCREENING BILATERAL      1. Acute pain of right shoulder (Primary) Recent fall with persistent pain and limited range of motion. No prior imaging or evaluation. -Advise over-the-counter ibuprofen and ice for pain management.  General Health Maintenance -Order mammogram. -Provide application for [place] financial assistance. -Follow-up in a few weeks after the holidays. - DG Shoulder Right; Future  2. Primary hypertension Continue current regimen - CBC with Differential/Platelet - Comp. Metabolic Panel (12) - losartan (COZAAR) 50 MG tablet; Take 1 tablet (50 mg total) by mouth daily.  Dispense: 30 tablet; Refill: 1 - chlorthalidone (HYGROTON) 25 MG tablet; TAKE 1 TABLET (25 MG TOTAL) BY MOUTH DAILY.  Dispense: 30 tablet; Refill: 1  3. Type 2 diabetes mellitus without complication, without long-term current use of insulin (HCC) Continue current regimen - Microalbumin / creatinine urine ratio - metFORMIN (GLUCOPHAGE-XR) 500 MG 24 hr tablet; Take 1 tablet (500 mg total) by mouth daily with breakfast.  Dispense: 30 tablet; Refill: 2  4. Acquired hypothyroidism Continue current regimen - Thyroid Panel With TSH - levothyroxine (SYNTHROID) 175 MCG tablet; Take 1 tablet (175 mcg  total) by mouth daily before breakfast.  Dispense: 30 tablet; Refill: 1  5. Hyperlipidemia, unspecified hyperlipidemia type Continue current regimen - atorvastatin (LIPITOR) 40 MG tablet; Take 1 tablet (40 mg total) by mouth at bedtime.  Dispense: 30 tablet; Refill: 1  6. Gastroesophageal reflux disease without esophagitis Continue current regimen - omeprazole (PRILOSEC) 20 MG capsule; Take 1 capsule (20 mg total) by mouth daily.  Dispense: 30 capsule; Refill: 1  7. Encounter for screening mammogram for malignant neoplasm of breast Scholarship completed on patient's behalf - MM DIGITAL SCREENING BILATERAL; Future   General Health Maintenance -Order mammogram. -Provide application for [facility] financial assistance. Follow-up with mobile unit in approximately 3 weeks.   I have reviewed the patient's medical history (PMH, PSH, Social History, Family History, Medications, and allergies) , and have been updated if relevant. I spent 30 minutes reviewing chart and  face to face time with patient.    Return in about 3 weeks (around 07/04/2023) for With MMU.    Kasandra Knudsen Mayers, PA-C

## 2023-06-13 NOTE — Patient Instructions (Signed)
I sent refills of your medications to the pharmacy.  We will call you with your lab results, mammogram results and x-ray results when they are available.  You will return to the mobile unit in approximately 3 weeks for follow-up.  We will call you to let you know of our location.  Roney Jaffe, PA-C Physician Assistant [facility] [email or web address]   Dolor en el hombro Shoulder Pain Muchas cosas pueden provocar dolor en el hombro, por ejemplo: Una lesin en el hombro. El uso excesivo del hombro. Artritis. La causa del dolor puede ser lo siguiente: Inflamacin. Una lesin en la articulacin del hombro. Una lesin en un tendn, ligamento o hueso. Siga estas indicaciones en su casa: Est atento a los [employer] sntomas. Informe a su mdico acerca de cualquier cambio. Siga estas indicaciones para Engineer, materials. Si tiene un cabestrillo extrable: selo como se lo haya indicado el mdico. Quteselo solamente como se lo haya indicado el mdico. Controle la piel alrededor del cabestrillo todos [place]. Informe al mdico acerca de cualquier inquietud. Afloje el cabestrillo si los dedos de la mano se le adormecen, se le enfran o siente hormigueo en ellos. Mantenga el cabestrillo limpio. Si el cabestrillo no es impermeable: No deje que se moje. Quteselo para ducharse o para baarse. Mueva el brazo lo menos posible, pero mantenga la mano en movimiento para evitar la hinchazn. Control del dolor, la rigidez y la hinchazn  Si se lo indican, aplique hielo sobre la zona dolorida. Si tiene un cabestrillo o un inmovilizador desmontable, quteselo como se lo haya indicado el mdico. Ponga el hielo en una bolsa plstica. Coloque una toalla entre la piel y Copy. Aplique el hielo durante 20 minutos, 2 o 3 veces por da. Si la piel se le pone de color rojo brillante, retire el hielo de inmediato para evitar daos en la piel.  El [place] de dao es mayor si no puede sentir dolor, Airline pilot o fro. Mueva los dedos de la mano con frecuencia para reducir la rigidez y la hinchazn. Apriete una pelota blanda o una almohadilla de goma tanto como sea posible. Esto ayuda e prevenir la hinchazn en el hombro. Tambin ayuda a Medical sales representative. Indicaciones generales Use los medicamentos de venta libre y los recetados solamente como se lo haya indicado el mdico. El ejercicio puede ayudar a Human resources officer. Haga ejercicios si se lo indic el mdico. Es posible que lo deriven a un fisioterapeuta para que lo ayude en el proceso de recuperacin. Cumpla con todas las visitas de seguimiento con el objeto de evitar complicaciones en el hombro y dolor crnico o discapacidad. Comunquese con un mdico si: El dolor no se alivia con los [employer]. Aparece un dolor nuevo en el brazo, la mano o los dedos. Afloja el cabestrillo y el brazo, la mano o los dedos permanecen adormecidos, hinchados, doloridos o con hormigueo. Solicite ayuda de inmediato si: El brazo, la mano o los dedos se tornan de color blanco o [place]. Esta informacin no tiene Theme park manager el consejo del mdico. Asegrese de hacerle al mdico cualquier pregunta que tenga. Document Revised: 02/14/2022 Document Reviewed: 02/14/2022 Elsevier Patient Education  2024 [employer].

## 2023-06-14 LAB — COMP. METABOLIC PANEL (12)
AST: 20 [IU]/L (ref 0–40)
Albumin: 4.5 g/dL (ref 3.9–4.9)
Alkaline Phosphatase: 74 [IU]/L (ref 44–121)
BUN/Creatinine Ratio: 22 (ref 12–28)
BUN: 15 mg/dL (ref 8–27)
Bilirubin Total: 0.4 mg/dL (ref 0.0–1.2)
Calcium: 9.9 mg/dL (ref 8.7–10.3)
Chloride: 105 mmol/L (ref 96–106)
Creatinine, Ser: 0.69 mg/dL (ref 0.57–1.00)
Globulin, Total: 2.7 g/dL (ref 1.5–4.5)
Glucose: 102 mg/dL — ABNORMAL HIGH (ref 70–99)
Potassium: 3.8 mmol/L (ref 3.5–5.2)
Sodium: 142 mmol/L (ref 134–144)
Total Protein: 7.2 g/dL (ref 6.0–8.5)
eGFR: 97 mL/min/{1.73_m2} (ref >59)

## 2023-06-14 LAB — THYROID PANEL WITH TSH
Free Thyroxine Index: 2.5 (ref 1.2–4.9)
T3 Uptake Ratio: 28 % (ref 24–39)
T4, Total: 8.8 ug/dL (ref 4.5–12.0)
TSH: 4.27 u[IU]/mL (ref 0.450–4.500)

## 2023-06-14 LAB — MICROALBUMIN / CREATININE URINE RATIO
Creatinine, Urine: 58 mg/dL
Microalb/Creat Ratio: 22 mg/g{creat} (ref 0–29)
Microalbumin, Urine: 13 ug/mL

## 2023-06-14 LAB — CBC WITH DIFFERENTIAL/PLATELET
Basophils Absolute: 0.1 10*3/uL (ref 0.0–0.2)
Basos: 1 %
EOS (ABSOLUTE): 0.3 10*3/uL (ref 0.0–0.4)
Eos: 4 %
Hematocrit: 42.2 % (ref 34.0–46.6)
Hemoglobin: 14.1 g/dL (ref 11.1–15.9)
Immature Grans (Abs): 0 10*3/uL (ref 0.0–0.1)
Immature Granulocytes: 0 %
Lymphocytes Absolute: 2.4 10*3/uL (ref 0.7–3.1)
Lymphs: 33 %
MCH: 31.5 pg (ref 26.6–33.0)
MCHC: 33.4 g/dL (ref 31.5–35.7)
MCV: 94 fL (ref 79–97)
Monocytes Absolute: 0.7 10*3/uL (ref 0.1–0.9)
Monocytes: 10 %
Neutrophils Absolute: 3.7 10*3/uL (ref 1.4–7.0)
Neutrophils: 52 %
Platelets: 301 10*3/uL (ref 150–450)
RBC: 4.48 x10E6/uL (ref 3.77–5.28)
RDW: 12.8 % (ref 11.7–15.4)
WBC: 7.1 10*3/uL (ref 3.4–10.8)

## 2023-06-15 ENCOUNTER — Encounter: Payer: Self-pay | Admitting: Physician Assistant

## 2023-06-25 ENCOUNTER — Other Ambulatory Visit: Payer: Self-pay

## 2023-08-03 ENCOUNTER — Telehealth: Payer: Self-pay

## 2023-08-03 NOTE — Telephone Encounter (Signed)
 Telephoned patient at mobile number using interpreter#[phone]. Left a voice message with [facility] (scholarship) contact information.

## 2023-08-17 ENCOUNTER — Ambulatory Visit: Payer: Self-pay | Admitting: Internal Medicine

## 2023-08-17 NOTE — Telephone Encounter (Signed)
 noted

## 2023-08-17 NOTE — Telephone Encounter (Signed)
 Copied from CRM [id number]. Topic: Clinical - Red Word Triage >> Aug 17, 2023  4:04 PM Gildardo Pounds wrote: Red Word that prompted transfer to Nurse Triage: Ruthy Dick, daughter, fell down on 3 months ago, cannot sleep because arm is hurting so bad. Hurting all day. 10 pain level. Callback number is [phone]    Chief Complaint: Arm pain Symptoms: Persistent pain in both arms Pertinent Negatives: Patient denies any other symptoms Disposition: [] ED /[] Urgent Care (no appt availability in office) / [x] Appointment(In office/virtual)/ []  [place] [facility]/ [] Home Care/ [] Refused Recommended Disposition /[] [place] [facility]/ []  Follow-up with PCP Additional Notes: Patient 's daughter called the office to request an appointment. She stated that patient fell 3 months ago and hurt her arms. The pain is mostly in the right arm around the shoulder area. Her level of pain varies from day to day. She reported having severe pain, but she is able to get pain relief when applying ice. Next available appointment with PCP is in April. Sooner appt scheduled with different provider on 3/6. Advised patient to be seen in ED if the pain is not tolerable. Patient's daughter verbalized understanding.    Reason for Disposition  Arm pain is a chronic symptom (recurrent or ongoing AND present > 4 weeks)  Answer Assessment - Initial Assessment Questions 1. ONSET: "When did the pain start?"     3 months ago  2. LOCATION: "Where is the pain located?"     Both arms, mostly right arm around shoulder area  3. PAIN: "How bad is the pain?" (Scale 1-10; or mild, moderate, severe)   - MILD (1-3): Doesn't interfere with normal activities.   - MODERATE (4-7): Interferes with normal activities (e.g., work or school) or awakens from sleep.   - SEVERE (8-10): Excruciating pain, unable to do any normal activities, unable to hold a cup of water.     Pain level varies, 10/10. Some days it hurts more than others. Patient gets pain  relief with applying ice.  4. CAUSE: "What do you think is causing the arm pain?"     Patient fell a few months ago  5. OTHER SYMPTOMS: "Do you have any other symptoms?" (e.g., neck pain, swelling, rash, fever, numbness, weakness)     Arm was swollen when she fell but not anymore  Protocols used: Arm Pain-A-AH

## 2023-08-20 ENCOUNTER — Encounter: Payer: Self-pay | Admitting: Internal Medicine

## 2023-08-30 ENCOUNTER — Encounter: Payer: Self-pay | Admitting: Physician Assistant

## 2023-08-30 ENCOUNTER — Other Ambulatory Visit: Payer: Self-pay

## 2023-08-30 ENCOUNTER — Ambulatory Visit: Payer: Self-pay | Attending: Physician Assistant | Admitting: Physician Assistant

## 2023-08-30 VITALS — BP 189/91 | HR 70 | Wt 186.4 lb

## 2023-08-30 DIAGNOSIS — E119 Type 2 diabetes mellitus without complications: Secondary | ICD-10-CM

## 2023-08-30 DIAGNOSIS — M25522 Pain in left elbow: Secondary | ICD-10-CM

## 2023-08-30 DIAGNOSIS — E039 Hypothyroidism, unspecified: Secondary | ICD-10-CM

## 2023-08-30 DIAGNOSIS — Z7984 Long term (current) use of oral hypoglycemic drugs: Secondary | ICD-10-CM

## 2023-08-30 DIAGNOSIS — E785 Hyperlipidemia, unspecified: Secondary | ICD-10-CM

## 2023-08-30 DIAGNOSIS — M79602 Pain in left arm: Secondary | ICD-10-CM

## 2023-08-30 DIAGNOSIS — I10 Essential (primary) hypertension: Secondary | ICD-10-CM

## 2023-08-30 LAB — GLUCOSE, POCT (MANUAL RESULT ENTRY): POC Glucose: 140 mg/dL — AB (ref 70–99)

## 2023-08-30 MED ORDER — CHLORTHALIDONE 25 MG PO TABS
ORAL_TABLET | Freq: Every day | ORAL | 1 refills | Status: DC
  Filled 2023-08-30: qty 30, 30d supply, fill #0
  Filled 2023-10-08: qty 30, 30d supply, fill #1

## 2023-08-30 MED ORDER — ATORVASTATIN CALCIUM 40 MG PO TABS
40.0000 mg | ORAL_TABLET | Freq: Every day | ORAL | 1 refills | Status: DC
  Filled 2023-08-30: qty 30, 30d supply, fill #0

## 2023-08-30 MED ORDER — MELOXICAM 15 MG PO TABS
15.0000 mg | ORAL_TABLET | Freq: Every day | ORAL | 0 refills | Status: DC
  Filled 2023-08-30: qty 30, 30d supply, fill #0

## 2023-08-30 MED ORDER — METFORMIN HCL ER 500 MG PO TB24
500.0000 mg | ORAL_TABLET | Freq: Every day | ORAL | 2 refills | Status: DC
  Filled 2023-08-30: qty 30, 30d supply, fill #0

## 2023-08-30 MED ORDER — LOSARTAN POTASSIUM 50 MG PO TABS
50.0000 mg | ORAL_TABLET | Freq: Every day | ORAL | 1 refills | Status: DC
  Filled 2023-08-30: qty 30, 30d supply, fill #0

## 2023-08-30 MED ORDER — LEVOTHYROXINE SODIUM 175 MCG PO TABS
175.0000 ug | ORAL_TABLET | Freq: Every day | ORAL | 1 refills | Status: DC
  Filled 2023-08-30: qty 30, 30d supply, fill #0

## 2023-08-30 NOTE — Progress Notes (Signed)
 Caroline Oneal #[id number]

## 2023-08-30 NOTE — Progress Notes (Signed)
 Patient ID: Caroline Oneal, female   DOB: 1960-07-21, 63 y.o.   MRN: [id number]   Betsey Sossamon, is a 63 y.o. female  [id number]  [id number]  DOB - 1960-10-19  Chief Complaint  Patient presents with   Arm Pain    Patient stated that she has a small dog that she has tripper over  and hurt both arms, the left arm is bothering her the most (shoulder to hand). No numbness or tingling present        Subjective:   Caroline Oneal is a 63 y.o. female here today for RF on some of her meds and for L arm pain now for several months after tripping over her dog and landed with her arms outstretched.  She is R hand dominant.  She feels her L arm pain never went away after the fall and if anything, it worsened.  She has trouble with day to day tasks and has to use her R hand/arm for things.  No numbness or weakness.  She has not taken anything for relief.  Supination and pronation are painful  No problems updated.  ALLERGIES: Allergies  Allergen Reactions   Hctz [Hydrochlorothiazide] Other (See Comments)    Chest pain    Norvasc [Amlodipine Besylate]     CP and dizziness    PAST MEDICAL HISTORY: Past Medical History:  Diagnosis Date   HTN (hypertension) 10/13/2011   Thyroid disease Dx 2016   Tubal ligation status 10/13/2011   Tubal ligation status 10/13/2011    MEDICATIONS AT HOME: Prior to Admission medications   Medication Sig Start Date End Date Taking? Authorizing Provider  meloxicam (MOBIC) 15 MG tablet Take 1 tablet (15 mg total) by mouth daily as need for pain 08/30/23  Yes Georgian Co M, PA-C  atorvastatin (LIPITOR) 40 MG tablet Take 1 tablet (40 mg total) by mouth at bedtime. 08/30/23   Anders Simmonds, PA-C  Blood Glucose Monitoring Suppl (TRUE METRIX METER) w/Device KIT Use to check blood sugar 2-3 times weekly. Patient not taking: Reported on 08/30/2023 04/27/21   Marcine Matar, MD  chlorthalidone (HYGROTON) 25 MG tablet TAKE 1 TABLET (25  MG TOTAL) BY MOUTH DAILY. 08/30/23 08/29/24  Anders Simmonds, PA-C  glucose blood (TRUE METRIX BLOOD GLUCOSE TEST) test strip Use to check blood sugar 2-3 times weekly. Patient not taking: Reported on 08/30/2023 10/25/21   Storm Frisk, MD  levothyroxine (SYNTHROID) 175 MCG tablet Take 1 tablet (175 mcg total) by mouth daily before breakfast. 08/30/23   Anders Simmonds, PA-C  losartan (COZAAR) 50 MG tablet Take 1 tablet (50 mg total) by mouth daily. 08/30/23   Anders Simmonds, PA-C  metFORMIN (GLUCOPHAGE-XR) 500 MG 24 hr tablet Take 1 tablet (500 mg total) by mouth daily with breakfast. 08/30/23   Anders Simmonds, PA-C  Multiple Vitamin (MULTIVITAMIN WITH MINERALS) TABS tablet Take 1 tablet by mouth daily. Patient not taking: Reported on 08/30/2023    [provider]  omeprazole (PRILOSEC) 20 MG capsule Take 1 capsule (20 mg total) by mouth daily. Patient not taking: Reported on 08/30/2023 06/13/23   Mayers, Cari S, PA-C  pantoprazole (PROTONIX) 40 MG tablet Take 1 tablet (40 mg total) by mouth daily as needed. Patient not taking: Reported on 08/30/2023 02/28/22   Marcine Matar, MD  TRUEplus Lancets 28G MISC Use to check blood sugar 2-3 times weekly. Patient not taking: Reported on 08/30/2023 10/25/21   Storm Frisk, MD  ROS: Neg HEENT Neg resp Neg cardiac Neg GI Neg GU Neg psych Neg neuro  Objective:   Vitals:   08/30/23 1102  BP: (!) 189/91  Pulse: 70  SpO2: 97%  Weight: 186 lb 6.4 oz (84.6 kg)   Exam General appearance : Awake, alert, not in any distress. Speech Clear. Not toxic looking HEENT: Atraumatic and NormocephalicNeck: Supple, no JVD. No cervical lymphadenopathy.  Chest: Good air entry bilaterally, CTAB.  No rales/rhonchi/wheezing CVS: S1 S2 regular, no murmurs.  R and L arm examined.  She can only supinate and pronate about 20% and when she does there is crepitus at the elbow and she winces in pain.  The area over the brachioradialis is swollen and tender.    Extremities: B/L Lower Ext shows no edema, both legs are warm to touch Neurology: Awake alert, and oriented X 3, CN II-XII intact, Non focal Skin: No Rash  Data Review Lab Results  Component Value Date   HGBA1C 6.5 (A) 06/13/2023   HGBA1C 6.1 (H) 02/28/2022   HGBA1C 6.2 10/25/2021    Assessment & Plan   1. Type 2 diabetes mellitus without complication, without long-term current use of insulin (HCC) (Primary) Continue meds-due soon for A1C - Glucose (CBG) - metFORMIN (GLUCOPHAGE-XR) 500 MG 24 hr tablet; Take 1 tablet (500 mg total) by mouth daily with breakfast.  Dispense: 30 tablet; Refill: 2  2. Primary hypertension She was out of losartan-BP elevated today - losartan (COZAAR) 50 MG tablet; Take 1 tablet (50 mg total) by mouth daily.  Dispense: 30 tablet; Refill: 1 - chlorthalidone (HYGROTON) 25 MG tablet; TAKE 1 TABLET (25 MG TOTAL) BY MOUTH DAILY.  Dispense: 30 tablet; Refill: 1  3. Acquired hypothyroidism - levothyroxine (SYNTHROID) 175 MCG tablet; Take 1 tablet (175 mcg total) by mouth daily before breakfast.  Dispense: 30 tablet; Refill: 1  4. Hyperlipidemia, unspecified hyperlipidemia type - atorvastatin (LIPITOR) 40 MG tablet; Take 1 tablet (40 mg total) by mouth at bedtime.  Dispense: 30 tablet; Refill: 1  5. Left elbow pain - Ambulatory referral to Orthopedic Surgery - meloxicam (MOBIC) 15 MG tablet; Take 1 tablet (15 mg total) by mouth daily as need for pain  Dispense: 30 tablet; Refill: 0  6. Left arm pain - Ambulatory referral to Orthopedic Surgery - meloxicam (MOBIC) 15 MG tablet; Take 1 tablet (15 mg total) by mouth daily as need for pain  Dispense: 30 tablet; Refill: 0  AMN interpreters used and additional time performing visit was required.   Return in about 6 weeks (around 10/11/2023) for PCP for chronic conditions-[place].  The patient was given clear instructions to go to ER or return to medical center if symptoms don't improve, worsen or new problems  develop. The patient verbalized understanding. The patient was told to call to get lab results if they haven't heard anything in the next week.      Georgian Co, PA-C [facility] [place], [place] [phone]   08/30/2023, 1:05 PM

## 2023-09-11 ENCOUNTER — Other Ambulatory Visit (INDEPENDENT_AMBULATORY_CARE_PROVIDER_SITE_OTHER): Payer: Self-pay

## 2023-09-11 ENCOUNTER — Ambulatory Visit (INDEPENDENT_AMBULATORY_CARE_PROVIDER_SITE_OTHER): Payer: Self-pay | Admitting: Orthopaedic Surgery

## 2023-09-11 DIAGNOSIS — M25522 Pain in left elbow: Secondary | ICD-10-CM

## 2023-09-11 MED ORDER — METHYLPREDNISOLONE ACETATE 40 MG/ML IJ SUSP
40.0000 mg | INTRAMUSCULAR | Status: AC | PRN
  Administered 2023-09-11: 40 mg via INTRA_ARTICULAR

## 2023-09-11 MED ORDER — BUPIVACAINE HCL 0.5 % IJ SOLN
1.0000 mL | INTRAMUSCULAR | Status: AC | PRN
Start: 2023-09-11 — End: 2023-09-11
  Administered 2023-09-11: 1 mL via INTRA_ARTICULAR

## 2023-09-11 MED ORDER — LIDOCAINE HCL 1 % IJ SOLN
1.0000 mL | INTRAMUSCULAR | Status: AC | PRN
  Administered 2023-09-11: 1 mL

## 2023-09-11 NOTE — Progress Notes (Signed)
 Office Visit Note   Patient: Caroline Oneal           Date of Birth: [birth year]           MRN: [id number] Visit Date: 09/11/2023              Requested by: Anders Simmonds, PA-C [address] [place],  [place] [address] PCP: Marcine Matar, MD   Assessment & Plan: Visit Diagnoses:  1. Pain in left elbow     Plan: Patient is a 63 year old female with elbow pain.  Has pre-existing DJD and a healed radial head fracture likely from the fall 4 to 5 months ago.  Impression is posttraumatic arthritis.  Treatment options were discussed and she elected to undergo a cortisone injection.  She tolerated this injection well.  Follow-up as needed.  Follow-Up Instructions: No follow-ups on file.   Orders:  Orders Placed This Encounter  Procedures   XR Elbow 2 Views Left   No orders of the defined types were placed in this encounter.     Procedures: Medium Joint Inj: L elbow on 09/11/2023 1:45 PM Indications: pain Details: 25 G needle Medications: 1 mL lidocaine 1 %; 1 mL bupivacaine 0.5 %; 40 mg methylPREDNISolone acetate 40 MG/ML Outcome: tolerated well, no immediate complications Patient was prepped and draped in the usual sterile fashion.       Clinical Data: No additional findings.   Subjective: Chief Complaint  Patient presents with   Left Elbow - Pain    HPI Patient is a 63 year old female here for evaluation of left elbow pain for about 4 to 5 months.  She had a fall at that time while playing with her daughter's dog.  Denies any numbness and tingling.  She has pain along the left arm and the elbow. Review of Systems  Constitutional: Negative.   HENT: Negative.    Eyes: Negative.   Respiratory: Negative.    Cardiovascular: Negative.   Endocrine: Negative.   Musculoskeletal: Negative.   Neurological: Negative.   Hematological: Negative.   Psychiatric/Behavioral: Negative.    All other systems reviewed and are  negative.    Objective: Vital Signs: There were no vitals taken for this visit.  Physical Exam Vitals and nursing note reviewed.  Constitutional:      Appearance: She is well-developed.  HENT:     Head: Atraumatic.     Nose: Nose normal.  Eyes:     Extraocular Movements: Extraocular movements intact.  Cardiovascular:     Pulses: Normal pulses.  Pulmonary:     Effort: Pulmonary effort is normal.  Abdominal:     Palpations: Abdomen is soft.  Musculoskeletal:     Cervical back: Neck supple.  Skin:    General: Skin is warm.     Capillary Refill: Capillary refill takes less than 2 seconds.  Neurological:     Mental Status: She is alert. Mental status is at baseline.  Psychiatric:        Behavior: Behavior normal.        Thought Content: Thought content normal.        Judgment: Judgment normal.     Ortho Exam Exam nation of the left elbow shows full range of motion without significant pain.  Normal strength and sensation.  No bony tenderness. Specialty Comments:  No specialty comments available.  Imaging: XR Elbow 2 Views Left Result Date: 09/11/2023 X-rays of the left elbow show a healed radial head fracture.  There  is pre-existing degenerative changes throughout the elbow.    PMFS History: Patient Active Problem List   Diagnosis Date Noted   Type 2 diabetes mellitus without complication, without long-term current use of insulin (HCC) 04/13/2021   Gastroesophageal reflux disease without esophagitis 11/10/2020   Diastasis of rectus abdominis 11/10/2020   Hyperlipidemia 11/18/2017   Poor dentition 08/04/2015   Allergic rhinitis 01/13/2015   Hypothyroidism 06/03/2014   Class 2 severe obesity due to excess calories with serious comorbidity and body mass index (BMI) of 35.0 to 35.9 in adult [facility])    HTN (hypertension) 10/13/2011   Past Medical History:  Diagnosis Date   HTN (hypertension) 10/13/2011   Thyroid disease Dx 2016   Tubal ligation status 10/13/2011    Tubal ligation status 10/13/2011    Family History  Problem Relation Age of Onset   Breast cancer Neg Hx     Past Surgical History:  Procedure Laterality Date   TUBAL LIGATION     Social History   Occupational History   Not on file  Tobacco Use   Smoking status: Never   Smokeless tobacco: Never  Vaping Use   Vaping status: Never Used  Substance and Sexual Activity   Alcohol use: No   Drug use: No   Sexual activity: Not Currently    Partners: Male    Birth control/protection: Surgical    Comment: tubal

## 2023-10-09 ENCOUNTER — Other Ambulatory Visit: Payer: Self-pay

## 2023-10-11 ENCOUNTER — Other Ambulatory Visit: Payer: Self-pay

## 2023-10-26 ENCOUNTER — Other Ambulatory Visit: Payer: Self-pay

## 2023-10-26 ENCOUNTER — Encounter: Payer: Self-pay | Admitting: Internal Medicine

## 2023-10-26 ENCOUNTER — Ambulatory Visit: Payer: Self-pay | Attending: Internal Medicine | Admitting: Internal Medicine

## 2023-10-26 VITALS — BP 128/79 | HR 66 | Ht 60.0 in | Wt 177.0 lb

## 2023-10-26 DIAGNOSIS — I10 Essential (primary) hypertension: Secondary | ICD-10-CM

## 2023-10-26 DIAGNOSIS — E1159 Type 2 diabetes mellitus with other circulatory complications: Secondary | ICD-10-CM

## 2023-10-26 DIAGNOSIS — Z1211 Encounter for screening for malignant neoplasm of colon: Secondary | ICD-10-CM

## 2023-10-26 DIAGNOSIS — Z1231 Encounter for screening mammogram for malignant neoplasm of breast: Secondary | ICD-10-CM

## 2023-10-26 DIAGNOSIS — E039 Hypothyroidism, unspecified: Secondary | ICD-10-CM

## 2023-10-26 DIAGNOSIS — E785 Hyperlipidemia, unspecified: Secondary | ICD-10-CM

## 2023-10-26 DIAGNOSIS — Z23 Encounter for immunization: Secondary | ICD-10-CM

## 2023-10-26 DIAGNOSIS — L720 Epidermal cyst: Secondary | ICD-10-CM

## 2023-10-26 DIAGNOSIS — E119 Type 2 diabetes mellitus without complications: Secondary | ICD-10-CM

## 2023-10-26 DIAGNOSIS — E1169 Type 2 diabetes mellitus with other specified complication: Secondary | ICD-10-CM

## 2023-10-26 LAB — POCT GLYCOSYLATED HEMOGLOBIN (HGB A1C): HbA1c, POC (controlled diabetic range): 6.5 % (ref 0.0–7.0)

## 2023-10-26 LAB — GLUCOSE, POCT (MANUAL RESULT ENTRY): POC Glucose: 129 mg/dL — AB (ref 70–99)

## 2023-10-26 MED ORDER — TRUEPLUS LANCETS 28G MISC
2 refills | Status: AC
  Filled 2023-10-26: qty 100, 30d supply, fill #0

## 2023-10-26 MED ORDER — LEVOTHYROXINE SODIUM 175 MCG PO TABS
175.0000 ug | ORAL_TABLET | Freq: Every day | ORAL | 1 refills | Status: DC
  Filled 2023-10-26: qty 30, 30d supply, fill #0
  Filled 2023-12-13: qty 30, 30d supply, fill #1
  Filled 2024-01-30: qty 30, 30d supply, fill #2
  Filled 2024-03-26 (×2): qty 30, 30d supply, fill #3

## 2023-10-26 MED ORDER — LOSARTAN POTASSIUM 50 MG PO TABS
50.0000 mg | ORAL_TABLET | Freq: Every day | ORAL | 1 refills | Status: DC
  Filled 2023-10-26: qty 90, 90d supply, fill #0
  Filled 2024-03-26 (×2): qty 90, 90d supply, fill #1

## 2023-10-26 MED ORDER — CHLORTHALIDONE 25 MG PO TABS
ORAL_TABLET | Freq: Every day | ORAL | 1 refills | Status: DC
  Filled 2023-10-26: qty 90, fill #0

## 2023-10-26 MED ORDER — ATORVASTATIN CALCIUM 40 MG PO TABS
40.0000 mg | ORAL_TABLET | Freq: Every day | ORAL | 1 refills | Status: DC
  Filled 2023-10-26: qty 90, 90d supply, fill #0

## 2023-10-26 NOTE — Progress Notes (Signed)
 Patient ID: Caroline Oneal, female    DOB: 07/09/1960  MRN: [id number]  CC: Diabetes (Dm & HTN f/u. Paulita Boss tag on back, painful, growing in size X1 yr Valri Gee to pneumonia vax. )   Subjective: Caroline Oneal is a 63 y.o. female who presents for chronic ds management. Her concerns today include:  TN, HL, hypothyroidism, DM, obesity.   [employer] Language interpreter used during this encounter. #Sebastian [id number]  DM: Results for orders placed or performed in visit on 10/26/23  POCT glucose (manual entry)   Collection Time: 10/26/23 11:41 AM  Result Value Ref Range   POC Glucose 129 (A) 70 - 99 mg/dl  POCT glycosylated hemoglobin (Hb A1C)   Collection Time: 10/26/23 11:45 AM  Result Value Ref Range   Hemoglobin A1C     HbA1c POC (<> result, manual entry)     HbA1c, POC (prediabetic range)     HbA1c, POC (controlled diabetic range) 6.5 0.0 - 7.0 %  -Should be on Metformin  500 mg daily but not taking.  States the pharmacy never dispensed to her. Has not taken in almost a yr. -checks BS 2-3x/wk.  Reports good readings. -does well with eating habits. Eating lots fruits and veggies and no sugary drinks -rides stationary bike daily at gym for exercise -over due for DM eye exam.  No insurance HL: should be on Lipitor 40 mg daily.  Taking and tolerating. HTN: taking the Cozaar  50 mg and Chlorthalidone  and took already today.  No device to check BP No CP/SOB/LE edema Thyroid : taking and tolerating the Levothyroxine .  Complains of having a painful skin tag on her upper back between the shoulder blades for about 1 year.  It has increased in size.  Would like to have it removed.  HM: declines shingles vaccine.  Yes to PCV 20. Due for MMG and FIT and pap Discussed the use of AI scribe software for clinical note transcription with the patient, who gave verbal consent to proceed.   Patient Active Problem List   Diagnosis Date Noted   Type 2 diabetes mellitus without complication,  without long-term current use of insulin (HCC) 04/13/2021   Gastroesophageal reflux disease without esophagitis 11/10/2020   Diastasis of rectus abdominis 11/10/2020   Hyperlipidemia 11/18/2017   Poor dentition 08/04/2015   Allergic rhinitis 01/13/2015   Hypothyroidism 06/03/2014   Class 2 severe obesity due to excess calories with serious comorbidity and body mass index (BMI) of 35.0 to 35.9 in adult [facility])    HTN (hypertension) 10/13/2011     Current Outpatient Medications on File Prior to Visit  Medication Sig Dispense Refill   Blood Glucose Monitoring Suppl (TRUE METRIX METER) w/Device KIT Use to check blood sugar 2-3 times weekly. 1 kit 0   glucose blood (TRUE METRIX BLOOD GLUCOSE TEST) test strip Use to check blood sugar 2-3 times weekly. 100 each 2   Multiple Vitamin (MULTIVITAMIN WITH MINERALS) TABS tablet Take 1 tablet by mouth daily.     omeprazole  (PRILOSEC) 20 MG capsule Take 1 capsule (20 mg total) by mouth daily. 30 capsule 1   meloxicam  (MOBIC ) 15 MG tablet Take 1 tablet (15 mg total) by mouth daily as need for pain (Patient not taking: Reported on 10/26/2023) 30 tablet 0   No current facility-administered medications on file prior to visit.    Allergies  Allergen Reactions   Hctz [Hydrochlorothiazide ] Other (See Comments)    Chest pain    Norvasc  [Amlodipine  Besylate]  CP and dizziness    Social History   Socioeconomic History   Marital status: Single    Spouse name: Not on file   Number of children: 4   Years of education: Not on file   Highest education level: 6th grade  Occupational History   Not on file  Tobacco Use   Smoking status: Never   Smokeless tobacco: Never  Vaping Use   Vaping status: Never Used  Substance and Sexual Activity   Alcohol use: No   Drug use: No   Sexual activity: Not Currently    Partners: Male    Birth control/protection: Surgical    Comment: tubal  Other Topics Concern   Not on file  Social History Narrative   Not on  file   Social Drivers of Health   Financial Resource Strain: Not on file  Food Insecurity: Food Insecurity Present (02/22/2021)   Hunger Vital Sign    Worried About Running Out of Food in the Last Year: Often true    Ran Out of Food in the Last Year: Often true  Transportation Needs: Unmet Transportation Needs (02/22/2021)   PRAPARE - Administrator, Civil Service (Medical): Yes    Lack of Transportation (Non-Medical): Yes  Physical Activity: Not on file  Stress: Not on file  Social Connections: Not on file  Intimate Partner Violence: Not on file    Family History  Problem Relation Age of Onset   Breast cancer Neg Hx     Past Surgical History:  Procedure Laterality Date   TUBAL LIGATION      ROS: Review of Systems Negative except as stated above  PHYSICAL EXAM: BP 128/79   Pulse 66   Ht 5' (1.524 m)   Wt 177 lb (80.3 kg)   SpO2 96%   BMI 34.57 kg/m   Physical Exam  General appearance - alert, well appearing, and in no distress Mental status - normal mood, behavior, speech, dress, motor activity, and thought processes Neck - supple, no significant adenopathy Chest - clear to auscultation, no wheezes, rales or rhonchi, symmetric air entry Heart - normal rate, regular rhythm, normal S1, S2, no murmurs, rubs, clicks or gallops Extremities - peripheral pulses normal, no pedal edema, no clubbing or cyanosis Skin -2 x 2 cm firm raised cystic lesion on the upper posterior thorax between the shoulder blade.  No erythema or edema.  Slightly tender to touch.  No fluctuance.      Latest Ref Rng & Units 06/13/2023    2:26 PM 02/28/2022    4:47 PM 10/25/2021    3:46 PM  CMP  Glucose 70 - 99 mg/dL 161  096  96   BUN 8 - 27 mg/dL 15  14  12    Creatinine 0.57 - 1.00 mg/dL 0.45  4.09  8.11   Sodium 134 - 144 mmol/L 142  141  141   Potassium 3.5 - 5.2 mmol/L 3.8  4.0  3.9   Chloride 96 - 106 mmol/L 105  106  103   CO2 20 - 29 mmol/L  25  24   Calcium  8.7 - 10.3  mg/dL 9.9  9.7  91.4   Total Protein 6.0 - 8.5 g/dL 7.2  7.0    Total Bilirubin 0.0 - 1.2 mg/dL 0.4  0.2    Alkaline Phos 44 - 121 IU/L 74  104    AST 0 - 40 IU/L 20  19    ALT 0 - 32 IU/L  28     Lipid Panel     Component Value Date/Time   CHOL 200 (H) 02/28/2022 1647   TRIG 362 (H) 02/28/2022 1647   HDL 49 02/28/2022 1647   CHOLHDL 4.1 02/28/2022 1647   CHOLHDL 4.0 06/03/2014 1043   VLDL 23 06/03/2014 1043   LDLCALC 91 02/28/2022 1647    CBC    Component Value Date/Time   WBC 7.1 06/13/2023 1426   WBC 8.6 10/15/2020 1644   RBC 4.48 06/13/2023 1426   RBC 5.02 10/15/2020 1644   HGB 14.1 06/13/2023 1426   HCT 42.2 06/13/2023 1426   PLT 301 06/13/2023 1426   MCV 94 06/13/2023 1426   MCH 31.5 06/13/2023 1426   MCH 31.7 10/15/2020 1644   MCHC 33.4 06/13/2023 1426   MCHC 34.3 10/15/2020 1644   RDW 12.8 06/13/2023 1426   LYMPHSABS 2.4 06/13/2023 1426   MONOABS 0.9 10/15/2020 1644   EOSABS 0.3 06/13/2023 1426   BASOSABS 0.1 06/13/2023 1426    ASSESSMENT AND PLAN: 1. Type 2 diabetes mellitus without complication, without long-term current use of insulin (HCC) (Primary) At goal off medication.  We agreed to continue to keep her off metformin  since she is doing well without it.  Encouraged her to continue healthy eating habits and regular exercise. - POCT glycosylated hemoglobin (Hb A1C) - POCT glucose (manual entry) - TRUEplus Lancets 28G MISC; Use to check blood sugar 2-3 times weekly.  Dispense: 100 each; Refill: 2  2. Hyperlipidemia associated with type 2 diabetes mellitus (HCC) Continue atorvastatin .  Due for lipid check. - atorvastatin  (LIPITOR) 40 MG tablet; Take 1 tablet (40 mg total) by mouth at bedtime.  Dispense: 90 tablet; Refill: 1 - Lipid panel  3. Hypertension associated with type 2 diabetes mellitus (HCC) At goal.  Continue chlorthalidone  and Cozaar  at current doses. - chlorthalidone  (HYGROTON ) 25 MG tablet; TAKE 1 TABLET (25 MG TOTAL) BY MOUTH DAILY.   Dispense: 90 tablet; Refill: 1 - losartan  (COZAAR ) 50 MG tablet; Take 1 tablet (50 mg total) by mouth daily.  Dispense: 90 tablet; Refill: 1  4. Acquired hypothyroidism Last TSH in December was normal.  Continue levothyroxine  175 mcg daily - levothyroxine  (SYNTHROID ) 175 MCG tablet; Take 1 tablet (175 mcg total) by mouth daily before breakfast.  Dispense: 90 tablet; Refill: 1  5. Epidermal cyst -Ambulatory referral to Dermatology  6. Need for vaccination against Streptococcus pneumoniae - Pneumococcal conjugate vaccine 20-valent  7. Screening for colon cancer - Fecal occult blood, imunochemical([facility])  8. Encounter for screening mammogram for malignant neoplasm of breast Mammogram scholarship given - MM 3D SCREENING MAMMOGRAM BILATERAL BREAST; Future   Patient was given the opportunity to ask questions.  Patient verbalized understanding of the plan and was able to repeat key elements of the plan.   This documentation was completed using Paediatric nurse.  Any transcriptional errors are unintentional.  Orders Placed This Encounter  Procedures   Fecal occult blood, imunochemical([facility])   MM 3D SCREENING MAMMOGRAM BILATERAL BREAST   Pneumococcal conjugate vaccine 20-valent   Lipid panel   Ambulatory referral to Dermatology   POCT glycosylated hemoglobin (Hb A1C)   POCT glucose (manual entry)     Requested Prescriptions   Signed Prescriptions Disp Refills   atorvastatin  (LIPITOR) 40 MG tablet 90 tablet 1    Sig: Take 1 tablet (40 mg total) by mouth at bedtime.   chlorthalidone  (HYGROTON ) 25 MG tablet 90 tablet 1    Sig: TAKE 1 TABLET (25 MG  TOTAL) BY MOUTH DAILY.   levothyroxine  (SYNTHROID ) 175 MCG tablet 90 tablet 1    Sig: Take 1 tablet (175 mcg total) by mouth daily before breakfast.   losartan  (COZAAR ) 50 MG tablet 90 tablet 1    Sig: Take 1 tablet (50 mg total) by mouth daily.   TRUEplus Lancets 28G MISC 100 each 2    Sig: Use  to check blood sugar 2-3 times weekly.    Return in about 6 weeks (around 12/07/2023) for PAP.  Concetta Dee, MD, FACP

## 2023-11-05 ENCOUNTER — Ambulatory Visit (INDEPENDENT_AMBULATORY_CARE_PROVIDER_SITE_OTHER): Payer: Self-pay | Admitting: Internal Medicine

## 2023-11-05 ENCOUNTER — Encounter: Payer: Self-pay | Admitting: Internal Medicine

## 2023-11-05 ENCOUNTER — Other Ambulatory Visit: Payer: Self-pay

## 2023-11-05 VITALS — BP 130/80 | HR 59 | Temp 98.3°F | Ht 60.0 in | Wt 180.2 lb

## 2023-11-05 DIAGNOSIS — E119 Type 2 diabetes mellitus without complications: Secondary | ICD-10-CM

## 2023-11-05 DIAGNOSIS — I10 Essential (primary) hypertension: Secondary | ICD-10-CM

## 2023-11-05 DIAGNOSIS — Z1231 Encounter for screening mammogram for malignant neoplasm of breast: Secondary | ICD-10-CM

## 2023-11-05 DIAGNOSIS — E039 Hypothyroidism, unspecified: Secondary | ICD-10-CM

## 2023-11-05 DIAGNOSIS — Z1211 Encounter for screening for malignant neoplasm of colon: Secondary | ICD-10-CM

## 2023-11-05 DIAGNOSIS — L723 Sebaceous cyst: Secondary | ICD-10-CM

## 2023-11-05 DIAGNOSIS — E782 Mixed hyperlipidemia: Secondary | ICD-10-CM

## 2023-11-05 LAB — LIPID PANEL
Cholesterol: 252 mg/dL — ABNORMAL HIGH (ref 0–200)
HDL: 43.1 mg/dL (ref >39.00)
NonHDL: 209.33
Total CHOL/HDL Ratio: 6
Triglycerides: 511 mg/dL — ABNORMAL HIGH (ref 0.0–149.0)
VLDL: 102.2 mg/dL — ABNORMAL HIGH (ref 0.0–40.0)

## 2023-11-05 LAB — CBC WITH DIFFERENTIAL/PLATELET
Basophils Absolute: 0.1 10*3/uL (ref 0.0–0.1)
Basophils Relative: 1.2 % (ref 0.0–3.0)
Eosinophils Absolute: 0.3 10*3/uL (ref 0.0–0.7)
Eosinophils Relative: 4.3 % (ref 0.0–5.0)
HCT: 43.5 % (ref 36.0–46.0)
Hemoglobin: 14.8 g/dL (ref 12.0–15.0)
Lymphocytes Relative: 30.2 % (ref 12.0–46.0)
Lymphs Abs: 1.9 10*3/uL (ref 0.7–4.0)
MCHC: 33.9 g/dL (ref 30.0–36.0)
MCV: 92.2 fl (ref 78.0–100.0)
Monocytes Absolute: 0.6 10*3/uL (ref 0.1–1.0)
Monocytes Relative: 9.2 % (ref 3.0–12.0)
Neutro Abs: 3.6 10*3/uL (ref 1.4–7.7)
Neutrophils Relative %: 55.1 % (ref 43.0–77.0)
Platelets: 308 10*3/uL (ref 150.0–400.0)
RBC: 4.72 Mil/uL (ref 3.87–5.11)
RDW: 13.8 % (ref 11.5–15.5)
WBC: 6.4 10*3/uL (ref 4.0–10.5)

## 2023-11-05 LAB — COMPREHENSIVE METABOLIC PANEL WITH GFR
ALT: 30 U/L (ref 0–35)
AST: 20 U/L (ref 0–37)
Albumin: 4.4 g/dL (ref 3.5–5.2)
Alkaline Phosphatase: 75 U/L (ref 39–117)
BUN: 18 mg/dL (ref 6–23)
CO2: 27 meq/L (ref 19–32)
Calcium: 10.1 mg/dL (ref 8.4–10.5)
Chloride: 102 meq/L (ref 96–112)
Creatinine, Ser: 0.64 mg/dL (ref 0.40–1.20)
GFR: 93.51 mL/min (ref >60.00)
Glucose, Bld: 120 mg/dL — ABNORMAL HIGH (ref 70–99)
Potassium: 3.4 meq/L — ABNORMAL LOW (ref 3.5–5.1)
Sodium: 140 meq/L (ref 135–145)
Total Bilirubin: 0.6 mg/dL (ref 0.2–1.2)
Total Protein: 7.6 g/dL (ref 6.0–8.3)

## 2023-11-05 LAB — LDL CHOLESTEROL, DIRECT: Direct LDL: 118 mg/dL

## 2023-11-05 LAB — TSH: TSH: 4.2 u[IU]/mL (ref 0.35–5.50)

## 2023-11-05 NOTE — Progress Notes (Signed)
 New Patient Office Visit     CC/Reason for Visit: Establish care, discuss chronic conditions Previous PCP: Concetta Dee, MD Last Visit: 10/26/2023  HPI: Caroline Oneal is a 63 y.o. female who is coming in today for the above mentioned reasons. Past Medical History is significant for: Hypertension, hyperlipidemia, hypothyroidism, type 2 diabetes.  She has a sebaceous cyst in her back that she would like excised as she feels it has been growing in size.  Other than this no concerns.  She is overdue for breast cervical and colon cancer screening.   Past Medical/Surgical History: Past Medical History:  Diagnosis Date   HTN (hypertension) 10/13/2011   Thyroid  disease Dx 2016   Tubal ligation status 10/13/2011   Tubal ligation status 10/13/2011    Past Surgical History:  Procedure Laterality Date   TUBAL LIGATION      Social History:  reports that she has never smoked. She has never used smokeless tobacco. She reports that she does not drink alcohol and does not use drugs.  Allergies: Allergies  Allergen Reactions   Hctz [Hydrochlorothiazide ] Other (See Comments)    Chest pain    Norvasc  [Amlodipine  Besylate]     CP and dizziness    Family History:  Family History  Problem Relation Age of Onset   Breast cancer Neg Hx      Current Outpatient Medications:    Blood Glucose Monitoring Suppl (TRUE METRIX METER) w/Device KIT, Use to check blood sugar 2-3 times weekly., Disp: 1 kit, Rfl: 0   chlorthalidone  (HYGROTON ) 25 MG tablet, TAKE 1 TABLET (25 MG TOTAL) BY MOUTH DAILY., Disp: 90 tablet, Rfl: 1   glucose blood (TRUE METRIX BLOOD GLUCOSE TEST) test strip, Use to check blood sugar 2-3 times weekly., Disp: 100 each, Rfl: 2   levothyroxine  (SYNTHROID ) 175 MCG tablet, Take 1 tablet (175 mcg total) by mouth daily before breakfast., Disp: 90 tablet, Rfl: 1   losartan  (COZAAR ) 50 MG tablet, Take 1 tablet (50 mg total) by mouth daily., Disp: 90 tablet, Rfl: 1   Multiple  Vitamin (MULTIVITAMIN WITH MINERALS) TABS tablet, Take 1 tablet by mouth daily., Disp: , Rfl:    TRUEplus Lancets 28G MISC, Use to check blood sugar 2-3 times weekly., Disp: 100 each, Rfl: 2   atorvastatin  (LIPITOR) 40 MG tablet, Take 1 tablet (40 mg total) by mouth at bedtime. (Patient not taking: Reported on 11/05/2023), Disp: 90 tablet, Rfl: 1  Review of Systems:  Negative except as indicated in HPI.   Physical Exam: Vitals:   11/05/23 1313  BP: 130/80  Pulse: (!) 59  Temp: 98.3 F (36.8 C)  TempSrc: Oral  SpO2: 98%  Weight: 180 lb 3.2 oz (81.7 kg)  Height: 5' (1.524 m)   Body mass index is 35.19 kg/m.  Physical Exam Vitals reviewed.  Constitutional:      Appearance: Normal appearance. She is obese.  HENT:     Head: Normocephalic and atraumatic.  Eyes:     Conjunctiva/sclera: Conjunctivae normal.  Cardiovascular:     Rate and Rhythm: Normal rate and regular rhythm.  Pulmonary:     Effort: Pulmonary effort is normal.     Breath sounds: Normal breath sounds.  Skin:    General: Skin is warm and dry.          Comments: Apparent sebaceous cyst of her right middle back without signs of infection.  Neurological:     General: No focal deficit present.     Mental Status:  She is alert and oriented to person, place, and time.  Psychiatric:        Mood and Affect: Mood normal.        Behavior: Behavior normal.        Thought Content: Thought content normal.        Judgment: Judgment normal.     Flowsheet Row Office Visit from 10/26/2023 in [facility] [place]  PHQ-9 Total Score 2        Impression and Plan:  Encounter for screening mammogram for malignant neoplasm of breast -     Digital Screening Mammogram, Left and Right; Future  Type 2 diabetes mellitus without complication, without long-term current use of insulin (HCC) -     Ambulatory referral to Ophthalmology -     CBC with Differential/Platelet;  Future -     Comprehensive metabolic panel with GFR; Future  Mixed hyperlipidemia -     Lipid panel; Future  Acquired hypothyroidism -     TSH; Future  Primary hypertension  Sebaceous cyst -     Ambulatory referral to Dermatology  Screening for colon cancer -     Ambulatory referral to Gastroenterology   - Referral to Derm for apparent sebaceous cyst. - Referrals for mammogram, GI and GYN placed for appropriate screening. - Check labs today, recent A1c was well-controlled at 6.5, not currently on medication.   Time spent: 46 minutes reviewing chart, interviewing and examining patient and formulating plan of care.      Marguerita Shih, MD [facility]

## 2023-11-06 ENCOUNTER — Ambulatory Visit: Payer: Self-pay | Admitting: Internal Medicine

## 2023-11-06 ENCOUNTER — Other Ambulatory Visit: Payer: Self-pay

## 2023-11-06 DIAGNOSIS — E782 Mixed hyperlipidemia: Secondary | ICD-10-CM

## 2023-11-13 ENCOUNTER — Other Ambulatory Visit: Payer: Self-pay

## 2023-11-13 MED ORDER — POTASSIUM CHLORIDE CRYS ER 20 MEQ PO TBCR
20.0000 meq | EXTENDED_RELEASE_TABLET | Freq: Every day | ORAL | 0 refills | Status: DC
  Filled 2023-11-13: qty 3, 3d supply, fill #0

## 2023-11-26 ENCOUNTER — Other Ambulatory Visit: Payer: Self-pay

## 2023-12-06 ENCOUNTER — Encounter: Payer: Self-pay | Admitting: Internal Medicine

## 2023-12-07 ENCOUNTER — Encounter: Payer: Self-pay | Admitting: Internal Medicine

## 2023-12-07 ENCOUNTER — Ambulatory Visit: Payer: Self-pay | Attending: Internal Medicine | Admitting: Internal Medicine

## 2023-12-07 ENCOUNTER — Other Ambulatory Visit (HOSPITAL_COMMUNITY)
Admission: RE | Admit: 2023-12-07 | Discharge: 2023-12-07 | Disposition: A | Discharge status: Home / Self Care | Payer: Self-pay | Source: Ambulatory Visit | Attending: Internal Medicine | Admitting: Internal Medicine

## 2023-12-07 VITALS — BP 149/80 | HR 66 | Ht 60.0 in | Wt 182.0 lb

## 2023-12-07 DIAGNOSIS — Z1231 Encounter for screening mammogram for malignant neoplasm of breast: Secondary | ICD-10-CM

## 2023-12-07 DIAGNOSIS — E119 Type 2 diabetes mellitus without complications: Secondary | ICD-10-CM

## 2023-12-07 DIAGNOSIS — Z1211 Encounter for screening for malignant neoplasm of colon: Secondary | ICD-10-CM

## 2023-12-07 DIAGNOSIS — Z124 Encounter for screening for malignant neoplasm of cervix: Secondary | ICD-10-CM | POA: Insufficient documentation

## 2023-12-07 DIAGNOSIS — E1159 Type 2 diabetes mellitus with other circulatory complications: Secondary | ICD-10-CM

## 2023-12-07 DIAGNOSIS — I10 Essential (primary) hypertension: Secondary | ICD-10-CM

## 2023-12-07 NOTE — Progress Notes (Signed)
 Patient ID: Caroline Oneal, female    DOB: 08-20-1960  MRN: [id number]  CC: PAP Subjective: Caroline Oneal is a 63 y.o. female who presents for PAP Her concerns today include:  HTN, HL, hypothyroidism, DM, obesity.  AMN Language interpreter used during this encounter. #[id number], Mearl Spice History:  Pt is G4P4 Any hx of abn paps?: no Menses regular or irregular?: NA - postmenopausal How long does menses last? NA Menstrual flow light or heavy?: NA Method of birth control?:  NA Any vaginal dischg at this time?: No Dysuria?: no Any hx of STI?: no Sexually active with how many partners: none; separated Desires STI screen: yes Last MMG: given MMG scholarship on last visit; no call Family hx of uterine, cervical or breast cancer?:  no Due for colon CA screen.  Should have been given FIT on last visit but pt states she never received it.   HTN: Blood pressure noted to be elevated.  She has not taken the Cozaar  or chlorthalidone  as yet for today.  Plans to do so once she returns home.  Patient Active Problem List   Diagnosis Date Noted   Type 2 diabetes mellitus without complication, without long-term current use of insulin (HCC) 04/13/2021   Gastroesophageal reflux disease without esophagitis 11/10/2020   Diastasis of rectus abdominis 11/10/2020   Hyperlipidemia 11/18/2017   Poor dentition 08/04/2015   Allergic rhinitis 01/13/2015   Hypothyroidism 06/03/2014   Class 2 severe obesity due to excess calories with serious comorbidity and body mass index (BMI) of 35.0 to 35.9 in adult [facility])    HTN (hypertension) 10/13/2011     Current Outpatient Medications on File Prior to Visit  Medication Sig Dispense Refill   atorvastatin  (LIPITOR) 40 MG tablet Take 1 tablet (40 mg total) by mouth at bedtime. 90 tablet 1   Blood Glucose Monitoring Suppl (TRUE METRIX METER) w/Device KIT Use to check blood sugar 2-3 times weekly. 1 kit 0   chlorthalidone  (HYGROTON ) 25 MG tablet  TAKE 1 TABLET (25 MG TOTAL) BY MOUTH DAILY. 90 tablet 1   glucose blood (TRUE METRIX BLOOD GLUCOSE TEST) test strip Use to check blood sugar 2-3 times weekly. 100 each 2   levothyroxine  (SYNTHROID ) 175 MCG tablet Take 1 tablet (175 mcg total) by mouth daily before breakfast. 90 tablet 1   losartan  (COZAAR ) 50 MG tablet Take 1 tablet (50 mg total) by mouth daily. 90 tablet 1   Multiple Vitamin (MULTIVITAMIN WITH MINERALS) TABS tablet Take 1 tablet by mouth daily.     potassium chloride  SA (KLOR-CON  M) 20 MEQ tablet Take 1 tablet (20 mEq total) by mouth daily. 3 tablet 0   TRUEplus Lancets 28G MISC Use to check blood sugar 2-3 times weekly. 100 each 2   No current facility-administered medications on file prior to visit.    Allergies  Allergen Reactions   Hctz [Hydrochlorothiazide ] Other (See Comments)    Chest pain    Norvasc  [Amlodipine  Besylate]     CP and dizziness    Social History   Socioeconomic History   Marital status: Single    Spouse name: Not on file   Number of children: 4   Years of education: Not on file   Highest education level: 6th grade  Occupational History   Not on file  Tobacco Use   Smoking status: Never   Smokeless tobacco: Never  Vaping Use   Vaping status: Never Used  Substance and Sexual Activity   Alcohol use:  No   Drug use: No   Sexual activity: Not Currently    Partners: Male    Birth control/protection: Surgical    Comment: tubal  Other Topics Concern   Not on file  Social History Narrative   Not on file   Social Drivers of Health   Financial Resource Strain: Not on file  Food Insecurity: Food Insecurity Present (02/22/2021)   Hunger Vital Sign    Worried About Running Out of Food in the Last Year: Often true    Ran Out of Food in the Last Year: Often true  Transportation Needs: Unmet Transportation Needs (02/22/2021)   PRAPARE - Administrator, Civil Service (Medical): Yes    Lack of Transportation (Non-Medical): Yes   Physical Activity: Not on file  Stress: Not on file  Social Connections: Not on file  Intimate Partner Violence: Not on file    Family History  Problem Relation Age of Onset   Breast cancer Neg Hx     Past Surgical History:  Procedure Laterality Date   TUBAL LIGATION      ROS: Review of Systems Negative except as stated above  PHYSICAL EXAM: BP (!) 149/80   Pulse 66   Ht 5' (1.524 m)   Wt 182 lb (82.6 kg)   SpO2 95%   BMI 35.54 kg/m   Physical Exam  General appearance - alert, well appearing, and in no distress Mental status - normal mood, behavior, speech, dress, motor activity, and thought processes Breasts - CMA Clarisa present for breast and pelvic exam: breasts appear normal, no suspicious masses, no skin or nipple changes or axillary nodes Pelvic - normal external genitalia, vulva, vagina, cervix, uterus and adnexa.  Small polyp noted in cervical oz.     Latest Ref Rng & Units 11/05/2023    1:41 PM 06/13/2023    2:26 PM 02/28/2022    4:47 PM  CMP  Glucose 70 - 99 mg/dL 161  096  045   BUN 6 - 23 mg/dL 18  15  14    Creatinine 0.40 - 1.20 mg/dL 4.09  8.11  9.14   Sodium 135 - 145 mEq/L 140  142  141   Potassium 3.5 - 5.1 mEq/L 3.4  3.8  4.0   Chloride 96 - 112 mEq/L 102  105  106   CO2 19 - 32 mEq/L 27   25   Calcium  8.4 - 10.5 mg/dL 78.2  9.9  9.7   Total Protein 6.0 - 8.3 g/dL 7.6  7.2  7.0   Total Bilirubin 0.2 - 1.2 mg/dL 0.6  0.4  0.2   Alkaline Phos 39 - 117 U/L 75  74  104   AST 0 - 37 U/L 20  20  19    ALT 0 - 35 U/L 30   28    Lipid Panel     Component Value Date/Time   CHOL 252 (H) 11/05/2023 1341   CHOL 200 (H) 02/28/2022 1647   TRIG (H) 11/05/2023 1341    511.0 Triglyceride is over 400; calculations on Lipids are invalid.   HDL 43.10 11/05/2023 1341   HDL 49 02/28/2022 1647   CHOLHDL 6 11/05/2023 1341   VLDL 102.2 (H) 11/05/2023 1341   LDLCALC 91 02/28/2022 1647   LDLDIRECT 118.0 11/05/2023 1341    CBC    Component Value Date/Time    WBC 6.4 11/05/2023 1341   RBC 4.72 11/05/2023 1341   HGB 14.8 11/05/2023 1341   HGB  14.1 06/13/2023 1426   HCT 43.5 11/05/2023 1341   HCT 42.2 06/13/2023 1426   PLT 308.0 11/05/2023 1341   PLT 301 06/13/2023 1426   MCV 92.2 11/05/2023 1341   MCV 94 06/13/2023 1426   MCH 31.5 06/13/2023 1426   MCH 31.7 10/15/2020 1644   MCHC 33.9 11/05/2023 1341   RDW 13.8 11/05/2023 1341   RDW 12.8 06/13/2023 1426   LYMPHSABS 1.9 11/05/2023 1341   LYMPHSABS 2.4 06/13/2023 1426   MONOABS 0.6 11/05/2023 1341   EOSABS 0.3 11/05/2023 1341   EOSABS 0.3 06/13/2023 1426   BASOSABS 0.1 11/05/2023 1341   BASOSABS 0.1 06/13/2023 1426    ASSESSMENT AND PLAN: 1. Pap smear for cervical cancer screening (Primary) - Cytology - PAP - Cervicovaginal ancillary only  2. Screening for colon cancer - Fecal occult blood, imunochemical([facility])  3. Encounter for screening mammogram for malignant neoplasm of breast Patient given mammogram scholarship again.  4.  HTN associated with diabetes Not at goal.  Patient did not take medications as yet for today.  She will take her chlorthalidone  and Cozaar  when she returns home.  Blood pressure on previous visit last month was at goal.   Patient was given the opportunity to ask questions.  Patient verbalized understanding of the plan and was able to repeat key elements of the plan.   This documentation was completed using Paediatric nurse.  Any transcriptional errors are unintentional.  Orders Placed This Encounter  Procedures   Fecal occult blood, imunochemical([facility])   MM 3D SCREENING MAMMOGRAM BILATERAL BREAST     Requested Prescriptions    No prescriptions requested or ordered in this encounter    Return in about 4 months (around 04/07/2024).  Concetta Dee, MD, FACP

## 2023-12-10 ENCOUNTER — Ambulatory Visit: Payer: Self-pay | Admitting: Internal Medicine

## 2023-12-10 DIAGNOSIS — Z1211 Encounter for screening for malignant neoplasm of colon: Secondary | ICD-10-CM

## 2023-12-10 LAB — CERVICOVAGINAL ANCILLARY ONLY
Bacterial Vaginitis (gardnerella): NEGATIVE
Chlamydia: NEGATIVE
Comment: NEGATIVE
Comment: NEGATIVE
Comment: NEGATIVE
Comment: NORMAL
Neisseria Gonorrhea: NEGATIVE
Trichomonas: NEGATIVE

## 2023-12-11 ENCOUNTER — Telehealth: Payer: Self-pay

## 2023-12-11 NOTE — Telephone Encounter (Signed)
 Telephoned patient at mobile number using interpreter, Mechele Spiegel. No answer and voice mail unavailable at this time. [facility]

## 2023-12-12 LAB — CYTOLOGY - PAP
Comment: NEGATIVE
Diagnosis: NEGATIVE
High risk HPV: NEGATIVE

## 2023-12-13 ENCOUNTER — Other Ambulatory Visit: Payer: Self-pay

## 2023-12-14 ENCOUNTER — Other Ambulatory Visit: Payer: Self-pay

## 2023-12-21 LAB — FECAL OCCULT BLOOD, IMMUNOCHEMICAL

## 2023-12-21 NOTE — Telephone Encounter (Addendum)
 Patient returning call, Informed Caroline Oneal is with another patient and advised if E2C2 triage nurse can relay the message.

## 2024-01-03 ENCOUNTER — Other Ambulatory Visit: Payer: Self-pay

## 2024-01-03 DIAGNOSIS — Z1211 Encounter for screening for malignant neoplasm of colon: Secondary | ICD-10-CM

## 2024-01-04 ENCOUNTER — Ambulatory Visit: Payer: Self-pay | Admitting: Internal Medicine

## 2024-01-04 LAB — FECAL OCCULT BLOOD, IMMUNOCHEMICAL: Fecal Occult Bld: NEGATIVE

## 2024-01-30 ENCOUNTER — Other Ambulatory Visit: Payer: Self-pay

## 2024-01-31 ENCOUNTER — Other Ambulatory Visit: Payer: Self-pay

## 2024-02-06 ENCOUNTER — Ambulatory Visit (INDEPENDENT_AMBULATORY_CARE_PROVIDER_SITE_OTHER): Payer: Self-pay | Admitting: Internal Medicine

## 2024-02-06 ENCOUNTER — Other Ambulatory Visit: Payer: Self-pay

## 2024-02-06 ENCOUNTER — Encounter: Payer: Self-pay | Admitting: Internal Medicine

## 2024-02-06 VITALS — BP 130/80 | HR 76 | Temp 98.2°F | Ht 60.0 in | Wt 182.3 lb

## 2024-02-06 DIAGNOSIS — E119 Type 2 diabetes mellitus without complications: Secondary | ICD-10-CM

## 2024-02-06 DIAGNOSIS — I10 Essential (primary) hypertension: Secondary | ICD-10-CM

## 2024-02-06 DIAGNOSIS — E785 Hyperlipidemia, unspecified: Secondary | ICD-10-CM

## 2024-02-06 DIAGNOSIS — E1169 Type 2 diabetes mellitus with other specified complication: Secondary | ICD-10-CM

## 2024-02-06 DIAGNOSIS — E782 Mixed hyperlipidemia: Secondary | ICD-10-CM

## 2024-02-06 DIAGNOSIS — E039 Hypothyroidism, unspecified: Secondary | ICD-10-CM

## 2024-02-06 MED ORDER — ATORVASTATIN CALCIUM 40 MG PO TABS
40.0000 mg | ORAL_TABLET | Freq: Every day | ORAL | 1 refills | Status: AC
  Filled 2024-02-06 (×2): qty 90, 90d supply, fill #0

## 2024-02-06 NOTE — Progress Notes (Signed)
 Established Patient Office Visit     CC/Reason for Visit: Follow-up chronic conditions  HPI: Caroline Oneal is a 63 y.o. female who is coming in today for the above mentioned reasons. Past Medical History is significant for: Hypertension, hyperlipidemia, type 2 diabetes, hypothyroidism.  Feeling well without acute concerns or complaints.  Never started atorvastatin  that was prescribed.   Past Medical/Surgical History: Past Medical History:  Diagnosis Date   HTN (hypertension) 10/13/2011   Thyroid  disease Dx 2016   Tubal ligation status 10/13/2011   Tubal ligation status 10/13/2011    Past Surgical History:  Procedure Laterality Date   TUBAL LIGATION      Social History:  reports that she has never smoked. She has never used smokeless tobacco. She reports that she does not drink alcohol and does not use drugs.  Allergies: Allergies  Allergen Reactions   Hctz [Hydrochlorothiazide ] Other (See Comments)    Chest pain    Norvasc  [Amlodipine  Besylate]     CP and dizziness    Family History:  Family History  Problem Relation Age of Onset   Breast cancer Neg Hx      Current Outpatient Medications:    Blood Glucose Monitoring Suppl (TRUE METRIX METER) w/Device KIT, Use to check blood sugar 2-3 times weekly., Disp: 1 kit, Rfl: 0   chlorthalidone  (HYGROTON ) 25 MG tablet, TAKE 1 TABLET (25 MG TOTAL) BY MOUTH DAILY., Disp: 90 tablet, Rfl: 1   glucose blood (TRUE METRIX BLOOD GLUCOSE TEST) test strip, Use to check blood sugar 2-3 times weekly., Disp: 100 each, Rfl: 2   levothyroxine  (SYNTHROID ) 175 MCG tablet, Take 1 tablet (175 mcg total) by mouth daily before breakfast., Disp: 90 tablet, Rfl: 1   losartan  (COZAAR ) 50 MG tablet, Take 1 tablet (50 mg total) by mouth daily., Disp: 90 tablet, Rfl: 1   Multiple Vitamin (MULTIVITAMIN WITH MINERALS) TABS tablet, Take 1 tablet by mouth daily., Disp: , Rfl:    potassium chloride  SA (KLOR-CON  M) 20 MEQ tablet, Take 1 tablet  (20 mEq total) by mouth daily., Disp: 3 tablet, Rfl: 0   TRUEplus Lancets 28G MISC, Use to check blood sugar 2-3 times weekly., Disp: 100 each, Rfl: 2   atorvastatin  (LIPITOR) 40 MG tablet, Take 1 tablet (40 mg total) by mouth at bedtime., Disp: 90 tablet, Rfl: 1  Review of Systems:  Negative unless indicated in HPI.   Physical Exam: Vitals:   02/06/24 1402  BP: 130/80  Pulse: 76  Temp: 98.2 F (36.8 C)  TempSrc: Oral  SpO2: 95%  Weight: 182 lb 5 oz (82.7 kg)  Height: 5' (1.524 m)    Body mass index is 35.61 kg/m.   Physical Exam Vitals reviewed.  Constitutional:      Appearance: Normal appearance. She is obese.  HENT:     Head: Normocephalic and atraumatic.  Eyes:     Conjunctiva/sclera: Conjunctivae normal.  Cardiovascular:     Rate and Rhythm: Normal rate and regular rhythm.  Pulmonary:     Effort: Pulmonary effort is normal.     Breath sounds: Normal breath sounds.  Skin:    General: Skin is warm and dry.  Neurological:     General: No focal deficit present.     Mental Status: She is alert and oriented to person, place, and time.  Psychiatric:        Mood and Affect: Mood normal.        Behavior: Behavior normal.  Thought Content: Thought content normal.        Judgment: Judgment normal.      Impression and Plan:  Primary hypertension  Acquired hypothyroidism  Mixed hyperlipidemia  Type 2 diabetes mellitus without complication, without long-term current use of insulin (HCC)  Morbid obesity (HCC)  Hyperlipidemia associated with type 2 diabetes mellitus (HCC) -     Atorvastatin  Calcium ; Take 1 tablet (40 mg total) by mouth at bedtime.  Dispense: 90 tablet; Refill: 1   - Blood pressure is well-controlled on current. - TSH is within normal limits on current levothyroxine  dose. - Cholesterol is above goal for diabetic, start atorvastatin  40 mg daily and recheck lipids in 30 days. - Most recent A1c is 6.5, not currently on  medication. -Discussed healthy lifestyle, including increased physical activity and better food choices to promote weight loss.   Time spent:31 minutes reviewing chart, interviewing and examining patient and formulating plan of care.     Tully Theophilus Andrews, MD [facility]

## 2024-02-11 ENCOUNTER — Other Ambulatory Visit: Payer: Self-pay

## 2024-03-26 ENCOUNTER — Other Ambulatory Visit: Payer: Self-pay

## 2024-04-02 ENCOUNTER — Other Ambulatory Visit: Payer: Self-pay

## 2024-04-11 ENCOUNTER — Ambulatory Visit: Payer: Self-pay | Attending: Internal Medicine | Admitting: Internal Medicine

## 2024-04-11 ENCOUNTER — Encounter: Payer: Self-pay | Admitting: Internal Medicine

## 2024-04-11 ENCOUNTER — Other Ambulatory Visit: Payer: Self-pay

## 2024-04-11 DIAGNOSIS — Z23 Encounter for immunization: Secondary | ICD-10-CM

## 2024-04-11 DIAGNOSIS — Z1211 Encounter for screening for malignant neoplasm of colon: Secondary | ICD-10-CM

## 2024-04-11 DIAGNOSIS — Z1231 Encounter for screening mammogram for malignant neoplasm of breast: Secondary | ICD-10-CM

## 2024-04-11 DIAGNOSIS — I10 Essential (primary) hypertension: Secondary | ICD-10-CM

## 2024-04-11 DIAGNOSIS — I152 Hypertension secondary to endocrine disorders: Secondary | ICD-10-CM

## 2024-04-11 DIAGNOSIS — M1711 Unilateral primary osteoarthritis, right knee: Secondary | ICD-10-CM

## 2024-04-11 DIAGNOSIS — Z6835 Body mass index (BMI) 35.0-35.9, adult: Secondary | ICD-10-CM

## 2024-04-11 DIAGNOSIS — E1159 Type 2 diabetes mellitus with other circulatory complications: Secondary | ICD-10-CM

## 2024-04-11 DIAGNOSIS — E1169 Type 2 diabetes mellitus with other specified complication: Secondary | ICD-10-CM

## 2024-04-11 DIAGNOSIS — E039 Hypothyroidism, unspecified: Secondary | ICD-10-CM

## 2024-04-11 DIAGNOSIS — E785 Hyperlipidemia, unspecified: Secondary | ICD-10-CM

## 2024-04-11 LAB — POCT GLYCOSYLATED HEMOGLOBIN (HGB A1C): HbA1c, POC (controlled diabetic range): 6.5 % (ref 0.0–7.0)

## 2024-04-11 LAB — GLUCOSE, POCT (MANUAL RESULT ENTRY): POC Glucose: 167 mg/dL — AB (ref 70–99)

## 2024-04-11 MED ORDER — SPIRONOLACTONE 25 MG PO TABS
25.0000 mg | ORAL_TABLET | Freq: Every day | ORAL | 3 refills | Status: AC
  Filled 2024-04-11: qty 30, 30d supply, fill #0
  Filled 2024-06-13: qty 30, 30d supply, fill #1

## 2024-04-11 MED ORDER — MELOXICAM 15 MG PO TABS
15.0000 mg | ORAL_TABLET | Freq: Every day | ORAL | 0 refills | Status: AC
  Filled 2024-04-11: qty 30, 30d supply, fill #0

## 2024-04-11 NOTE — Progress Notes (Signed)
 Patient ID: Caroline Oneal, female    DOB: 1960-08-15  MRN: [id number]  CC: Diabetes (DM & HTN f/u.  - requesting med to alleviate pain/Pain on R knee X2 weeks/Flu vax administered on 04/11/2024 - C.A.)   Subjective: Caroline Oneal is a 63 y.o. female who presents for chronic ds management. Her concerns today include:  HTN, HL, hypothyroidism, DM, obesity.    AMN Language interpreter used during this encounter. #[phone]   Discussed the use of AI scribe software for clinical note transcription with the patient, who gave verbal consent to proceed.  History of Present Illness Caroline Oneal is a 63 year old female who presents for follow-up and management of her chronic conditions.  Should be on Cozaar  50 mg daily and chlorthalidone  25 mg daily for hypertension.  Even though she confirms taking both medications, it seems that the chlorthalidone  prescription that I wrote for her in May of this year was never dispensed.  The potassium was also never dispensed.   does not have a home device to monitor her blood pressure, relying instead on measurements taken during visits.   DM: Results for orders placed or performed in visit on 04/11/24  POCT glucose (manual entry)   Collection Time: 04/11/24  2:59 PM  Result Value Ref Range   POC Glucose 167 (A) 70 - 99 mg/dl  POCT glycosylated hemoglobin (Hb A1C)   Collection Time: 04/11/24  3:08 PM  Result Value Ref Range   Hemoglobin A1C     HbA1c POC (<> result, manual entry)     HbA1c, POC (prediabetic range)     HbA1c, POC (controlled diabetic range) 6.5 0.0 - 7.0 %  Her diabetes is managed with an A1c of 6.5%, maintained by checking her blood sugar every third day. She feels well with her current blood sugar levels and is mindful of her diet and exercise. However, she has not been to the gym for a month due to her daughter's absence, who usually accompanies her.  She has experienced knee pain for the past two  weeks, which worsens with walking. There is some swelling in the knee, but no recent injury. She had x-rays of her knee several years ago, which showed arthritis changes.  She is also taking levothyroxine  for hypothyroidism, with her last thyroid  level check in May showing good results.  HL: she continues to take medication for cholesterol management - Lipitor 40 mg  I note that she had seen [facility] since last visit with me to become established with them.  Patient states that she subsequently changed her mind as the distance to that office is too far for her.    Patient Active Problem List   Diagnosis Date Noted   Type 2 diabetes mellitus without complication, without long-term current use of insulin (HCC) 04/13/2021   Gastroesophageal reflux disease without esophagitis 11/10/2020   Diastasis of rectus abdominis 11/10/2020   Hyperlipidemia 11/18/2017   Poor dentition 08/04/2015   Allergic rhinitis 01/13/2015   Hypothyroidism 06/03/2014   Class 2 severe obesity due to excess calories with serious comorbidity and body mass index (BMI) of 35.0 to 35.9 in adult    HTN (hypertension) 10/13/2011     Current Outpatient Medications on File Prior to Visit  Medication Sig Dispense Refill   atorvastatin  (LIPITOR) 40 MG tablet Take 1 tablet (40 mg total) by mouth at bedtime. 90 tablet 1   Blood Glucose Monitoring Suppl (TRUE METRIX METER) w/Device KIT Use  to check blood sugar 2-3 times weekly. 1 kit 0   glucose blood (TRUE METRIX BLOOD GLUCOSE TEST) test strip Use to check blood sugar 2-3 times weekly. 100 each 2   levothyroxine  (SYNTHROID ) 175 MCG tablet Take 1 tablet (175 mcg total) by mouth daily before breakfast. 90 tablet 1   losartan  (COZAAR ) 50 MG tablet Take 1 tablet (50 mg total) by mouth daily. 90 tablet 1   Multiple Vitamin (MULTIVITAMIN WITH MINERALS) TABS tablet Take 1 tablet by mouth daily.     TRUEplus Lancets 28G MISC Use to check blood sugar 2-3 times weekly. 100  each 2   No current facility-administered medications on file prior to visit.    Allergies  Allergen Reactions   Hctz [Hydrochlorothiazide ] Other (See Comments)    Chest pain    Norvasc  [Amlodipine  Besylate]     CP and dizziness    Social History   Socioeconomic History   Marital status: Single    Spouse name: Not on file   Number of children: 4   Years of education: Not on file   Highest education level: 6th grade  Occupational History   Not on file  Tobacco Use   Smoking status: Never   Smokeless tobacco: Never  Vaping Use   Vaping status: Never Used  Substance and Sexual Activity   Alcohol use: No   Drug use: No   Sexual activity: Not Currently    Partners: Male    Birth control/protection: Surgical    Comment: tubal  Other Topics Concern   Not on file  Social History Narrative   Not on file   Social Drivers of Health   Financial Resource Strain: Medium Risk (04/11/2024)   Overall Financial Resource Strain (CARDIA)    Difficulty of Paying Living Expenses: Somewhat hard  Food Insecurity: Food Insecurity Present (04/11/2024)   Hunger Vital Sign    Worried About Running Out of Food in the Last Year: Often true    Ran Out of Food in the Last Year: Often true  Transportation Needs: No Transportation Needs (04/11/2024)   PRAPARE - Administrator, Civil Service (Medical): No    Lack of Transportation (Non-Medical): No  Physical Activity: Inactive (04/11/2024)   Exercise Vital Sign    Days of Exercise per Week: 0 days    Minutes of Exercise per Session: 0 min  Stress: No Stress Concern Present (04/11/2024)   [employer] of Occupational Health - Occupational Stress Questionnaire    Feeling of Stress: Not at all  Social Connections: Not on file  Intimate Partner Violence: Not At Risk (04/11/2024)   Humiliation, Afraid, Rape, and Kick questionnaire    Fear of Current or Ex-Partner: No    Emotionally Abused: No    Physically Abused: No     Sexually Abused: No    Family History  Problem Relation Age of Onset   Breast cancer Neg Hx     Past Surgical History:  Procedure Laterality Date   TUBAL LIGATION      ROS: Review of Systems Negative except as stated above  PHYSICAL EXAM: BP (!) 182/74 (BP Location: Left Arm, Patient Position: Sitting, Cuff Size: Normal)   Pulse 68   Temp 98 F (36.7 C) (Oral)   Ht 5' (1.524 m)   Wt 183 lb (83 kg)   SpO2 94%   BMI 35.74 kg/m   Wt Readings from Last 3 Encounters:  04/11/24 183 lb (83 kg)  02/06/24 182 lb 5 oz (  82.7 kg)  12/07/23 182 lb (82.6 kg)    Physical Exam  General appearance - alert, well appearing, and in no distress Mental status - normal mood, behavior, speech, dress, motor activity, and thought processes Neck - supple, no significant adenopathy Chest - clear to auscultation, no wheezes, rales or rhonchi, symmetric air entry Heart - normal rate, regular rhythm, normal S1, S2, no murmurs, rubs, clicks or gallops Musculoskeletal -knee: Mild to moderate joint enlargement.  She has moderate crepitus and mild discomfort with passive range of motion. Extremities - peripheral pulses normal, no pedal edema, no clubbing or cyanosis      Latest Ref Rng & Units 11/05/2023    1:41 PM 06/13/2023    2:26 PM 02/28/2022    4:47 PM  CMP  Glucose 70 - 99 mg/dL 879  897  889   BUN 6 - 23 mg/dL 18  15  14    Creatinine 0.40 - 1.20 mg/dL 9.35  9.30  9.40   Sodium 135 - 145 mEq/L 140  142  141   Potassium 3.5 - 5.1 mEq/L 3.4  3.8  4.0   Chloride 96 - 112 mEq/L 102  105  106   CO2 19 - 32 mEq/L 27   25   Calcium  8.4 - 10.5 mg/dL 89.8  9.9  9.7   Total Protein 6.0 - 8.3 g/dL 7.6  7.2  7.0   Total Bilirubin 0.2 - 1.2 mg/dL 0.6  0.4  0.2   Alkaline Phos 39 - 117 U/L 75  74  104   AST 0 - 37 U/L 20  20  19    ALT 0 - 35 U/L 30   28    Lipid Panel     Component Value Date/Time   CHOL 252 (H) 11/05/2023 1341   CHOL 200 (H) 02/28/2022 1647   TRIG (H) 11/05/2023 1341     511.0 Triglyceride is over 400; calculations on Lipids are invalid.   HDL 43.10 11/05/2023 1341   HDL 49 02/28/2022 1647   CHOLHDL 6 11/05/2023 1341   VLDL 102.2 (H) 11/05/2023 1341   LDLCALC 91 02/28/2022 1647   LDLDIRECT 118.0 11/05/2023 1341    CBC    Component Value Date/Time   WBC 6.4 11/05/2023 1341   RBC 4.72 11/05/2023 1341   HGB 14.8 11/05/2023 1341   HGB 14.1 06/13/2023 1426   HCT 43.5 11/05/2023 1341   HCT 42.2 06/13/2023 1426   PLT 308.0 11/05/2023 1341   PLT 301 06/13/2023 1426   MCV 92.2 11/05/2023 1341   MCV 94 06/13/2023 1426   MCH 31.5 06/13/2023 1426   MCH 31.7 10/15/2020 1644   MCHC 33.9 11/05/2023 1341   RDW 13.8 11/05/2023 1341   RDW 12.8 06/13/2023 1426   LYMPHSABS 1.9 11/05/2023 1341   LYMPHSABS 2.4 06/13/2023 1426   MONOABS 0.6 11/05/2023 1341   EOSABS 0.3 11/05/2023 1341   EOSABS 0.3 06/13/2023 1426   BASOSABS 0.1 11/05/2023 1341   BASOSABS 0.1 06/13/2023 1426    ASSESSMENT AND PLAN: 1. Type 2 diabetes mellitus associated with morbid obesity (HCC) (Primary) At goal and diet control.  Encourage healthy eating habits.  Encouraged her to get back into her exercise once her knee is feeling better. - POCT glycosylated hemoglobin (Hb A1C) - POCT glucose (manual entry)  2. Hypertension associated with type 2 diabetes mellitus (HCC) Not at goal.  Currently on Cozaar  50 mg daily.  Did not tolerate amlodipine  in the past.  She was noted to  have some issues with low potassium as well in the past.  Will add spironolactone 25 mg daily.  Advised patient that after she has been on the spironolactone for 1 week, she should return to the lab to have chemistry checked. - spironolactone (ALDACTONE) 25 MG tablet; Take 1 tablet (25 mg total) by mouth daily.  Dispense: 30 tablet; Refill: 3 - Basic Metabolic Panel; Future  3. Acquired hypothyroidism Continue levothyroxine . - TSH; Future  4. Hyperlipidemia associated with type 2 diabetes mellitus (HCC) Continue  atorvastatin  40 mg daily.  5. Primary osteoarthritis of right knee Will try her with meloxicam .  Encouraged weight loss. - meloxicam  (MOBIC ) 15 MG tablet; Take 1 tablet (15 mg total) by mouth daily.  Dispense: 30 tablet; Refill: 0  6. Need for immunization against influenza - Flu vaccine trivalent PF, 6mos and older(Flulaval,Afluria,Fluarix,Fluzone)  7. Screening for colon cancer - Fecal occult blood, imunochemical([facility])  8. Encounter for screening mammogram for malignant neoplasm of breast Mammogram scholarship given. - MM 3D SCREENING MAMMOGRAM BILATERAL BREAST; Future   Patient was given the opportunity to ask questions.  Patient verbalized understanding of the plan and was able to repeat key elements of the plan.   This documentation was completed using Paediatric nurse.  Any transcriptional errors are unintentional.  Orders Placed This Encounter  Procedures   Fecal occult blood, imunochemical([facility])   MM 3D SCREENING MAMMOGRAM BILATERAL BREAST   Flu vaccine trivalent PF, 6mos and older(Flulaval,Afluria,Fluarix,Fluzone)   Basic Metabolic Panel   TSH   POCT glycosylated hemoglobin (Hb A1C)   POCT glucose (manual entry)     Requested Prescriptions   Signed Prescriptions Disp Refills   spironolactone (ALDACTONE) 25 MG tablet 30 tablet 3    Sig: Take 1 tablet (25 mg total) by mouth daily.   meloxicam  (MOBIC ) 15 MG tablet 30 tablet 0    Sig: Take 1 tablet (15 mg total) by mouth daily.    Return in about 4 months (around 08/12/2024) for Lab appt in 1 wk. Appt with Herlene in 1 mth for BP recheck.  Barnie Louder, MD, FACP

## 2024-04-18 ENCOUNTER — Other Ambulatory Visit: Payer: Self-pay

## 2024-04-18 ENCOUNTER — Ambulatory Visit: Payer: Self-pay | Attending: Internal Medicine

## 2024-04-18 DIAGNOSIS — E039 Hypothyroidism, unspecified: Secondary | ICD-10-CM

## 2024-04-18 DIAGNOSIS — I152 Hypertension secondary to endocrine disorders: Secondary | ICD-10-CM

## 2024-04-19 ENCOUNTER — Other Ambulatory Visit: Payer: Self-pay

## 2024-04-19 ENCOUNTER — Other Ambulatory Visit: Payer: Self-pay | Admitting: Internal Medicine

## 2024-04-19 ENCOUNTER — Ambulatory Visit: Payer: Self-pay | Admitting: Internal Medicine

## 2024-04-19 DIAGNOSIS — I152 Hypertension secondary to endocrine disorders: Secondary | ICD-10-CM

## 2024-04-19 DIAGNOSIS — E039 Hypothyroidism, unspecified: Secondary | ICD-10-CM

## 2024-04-19 LAB — BASIC METABOLIC PANEL WITH GFR
BUN/Creatinine Ratio: 21 (ref 12–28)
BUN: 14 mg/dL (ref 8–27)
CO2: 23 mmol/L (ref 20–29)
Calcium: 9.9 mg/dL (ref 8.7–10.3)
Chloride: 107 mmol/L — ABNORMAL HIGH (ref 96–106)
Creatinine, Ser: 0.68 mg/dL (ref 0.57–1.00)
Glucose: 102 mg/dL — ABNORMAL HIGH (ref 70–99)
Potassium: 4.1 mmol/L (ref 3.5–5.2)
Sodium: 143 mmol/L (ref 134–144)
eGFR: 97 mL/min/1.73 (ref >59)

## 2024-04-19 LAB — FECAL OCCULT BLOOD, IMMUNOCHEMICAL: Fecal Occult Bld: NEGATIVE

## 2024-04-19 LAB — TSH: TSH: 12.1 u[IU]/mL — ABNORMAL HIGH (ref 0.450–4.500)

## 2024-04-19 MED ORDER — LOSARTAN POTASSIUM 50 MG PO TABS
50.0000 mg | ORAL_TABLET | Freq: Every day | ORAL | 1 refills | Status: DC
Start: 2024-04-19 — End: 2024-05-12
  Filled 2024-04-19: qty 90, 90d supply, fill #0

## 2024-04-19 NOTE — Progress Notes (Signed)
 Thyroid  level is not at goal.  Please confirm that she has been taking the levothyroxine  175 mcg consistently for the past 2 months.  If she has been doing so, then we need to increase the dose to 200 mcg daily and have her return to lab to have level recheck after being on new dose for 6 wks. Let me know her response.  Potassium level is good.  Kidney function normal.

## 2024-04-21 ENCOUNTER — Other Ambulatory Visit: Payer: Self-pay

## 2024-04-22 ENCOUNTER — Other Ambulatory Visit: Payer: Self-pay

## 2024-04-28 ENCOUNTER — Encounter: Payer: Self-pay | Admitting: Radiology

## 2024-04-30 ENCOUNTER — Other Ambulatory Visit: Payer: Self-pay

## 2024-04-30 ENCOUNTER — Other Ambulatory Visit: Payer: Self-pay | Admitting: Internal Medicine

## 2024-04-30 DIAGNOSIS — E039 Hypothyroidism, unspecified: Secondary | ICD-10-CM

## 2024-04-30 MED ORDER — LEVOTHYROXINE SODIUM 200 MCG PO TABS
200.0000 ug | ORAL_TABLET | Freq: Every day | ORAL | 3 refills | Status: AC
  Filled 2024-04-30: qty 30, 30d supply, fill #0

## 2024-05-01 ENCOUNTER — Ambulatory Visit
Admission: RE | Admit: 2024-05-01 | Discharge: 2024-05-01 | Disposition: A | Discharge status: Home / Self Care | Payer: Self-pay | Source: Ambulatory Visit | Attending: Internal Medicine | Admitting: Internal Medicine

## 2024-05-01 DIAGNOSIS — Z1231 Encounter for screening mammogram for malignant neoplasm of breast: Secondary | ICD-10-CM

## 2024-05-07 ENCOUNTER — Other Ambulatory Visit: Payer: Self-pay

## 2024-05-08 ENCOUNTER — Ambulatory Visit: Payer: Self-pay | Admitting: Internal Medicine

## 2024-05-08 DIAGNOSIS — E119 Type 2 diabetes mellitus without complications: Secondary | ICD-10-CM

## 2024-05-11 NOTE — Progress Notes (Unsigned)
 S:     No chief complaint on file.  63 y.o. female who presents for hypertension evaluation, education, and management. PMH is significant for HTN, T2DM, HLD, obesity, GERD.   Patient was referred and last seen by Primary Care Provider, Dr. Vicci, on 04/11/24. At last visit, blood pressure was not at goal. Dr. Vicci noted that chlorthalidone  was never dispensed for patient, so she was only taking losartan . Spironolactone 25 mg was started to help aid in lowering BP.  Pharmacy has not seen patient since 2021 for BP.  Interpreter [phone], Tita   Today, patient arrives in spirits and presents without assistance. Patient does not check BP at home. She brings in her medicine bottles and reports taking medicine everyday. Denies dizziness, headache, blurred vision, swelling. She relates her elevated BP to feeling nervous recently. Patient was not a good historian and did not seem to grasp why she was there for her visit.  Family/Social history:  Non smoker No alcohol  Current antihypertensives include: losartan  50 mg daily, spironolactone 25 mg daily  Antihypertensives tried in the past include: hydrochlorothiazide  (chest pain), amlodipine  (chest pain and dizziness)  Reported home blood pressure readings: does not check BP at home due to not having a BP cuff  Patient reported dietary habits: Eats 3 meals/day - no salt Breakfast: eggs with lettuce Lunch: chicken, beef Snacks: fruits (strawberries, apples with yogurt) Drinks: coffee (2 cups), no soda, just water   Patient-reported exercise habits: go to the gym  O:  Last 3 Office BP readings: BP Readings from Last 3 Encounters:  04/11/24 (!) 182/74  02/06/24 130/80  12/07/23 (!) 149/80    BMET    Component Value Date/Time   NA 143 04/18/2024 1418   K 4.1 04/18/2024 1418   CL 107 (H) 04/18/2024 1418   CO2 23 04/18/2024 1418   GLUCOSE 102 (H) 04/18/2024 1418   GLUCOSE 120 (H) 11/05/2023 1341   BUN 14 04/18/2024 1418    CREATININE 0.68 04/18/2024 1418   CREATININE 0.66 03/03/2016 1206   CALCIUM  9.9 04/18/2024 1418   GFRNONAA >60 10/15/2020 1644   GFRNONAA >89 03/03/2016 1206   GFRAA 79 07/22/2020 1519   GFRAA >89 03/03/2016 1206    Renal function: CrCl cannot be calculated (Patient's most recent lab result is older than the maximum 21 days allowed.).  Clinical ASCVD: Yes  The 10-year ASCVD risk score (Arnett DK, et al., 2019) is: 29.7%   Values used to calculate the score:     Age: 77 years     Clincally relevant sex: Female     Is Non-Hispanic African American: No     Diabetic: Yes     Tobacco smoker: No     Systolic Blood Pressure: 182 mmHg     Is BP treated: Yes     HDL Cholesterol: 43.1 mg/dL     Total Cholesterol: 252 mg/dL  A/P: Hypertension diagnosed currently uncontrolled on current medications. BP goal < 130/80 mmHg. BP elevated in clinic today. Medication adherence appears to be okay. Control is suboptimal due to possible confusion or lack of understanding. Discussed that elevated BP is not safe for the patient. She did not seem to grasp the severity of the situation. Provided patient with BP monitor for home readings. Denied demonstration as she has family at home who can speak English to explain her how to use. Attempted multiple times to do teach back for full understanding. Patient completed teach back but unsure if she understood.  -Increased  dose of losartan  50 mg to 100 mg daily.  -Continued spironolactone 25 mg daily.  -Patient educated on purpose, proper use, and potential adverse effects of losartan  and spironolactone.  -Recommended checking BP twice a day: once in the morning before coffee and once in the evening. Write down BP and bring them to next appointment. -F/u labs ordered - collected today -Counseled on lifestyle modifications for blood pressure control including reduced dietary sodium, increased exercise, adequate sleep. -Encouraged patient to check BP at home and  bring log of readings to next visit. Counseled on proper use of home BP cuff.   Results reviewed and written information provided.    Written patient instructions provided. Patient verbalized understanding of treatment plan.  Total time in face to face counseling 30 minutes.    Follow-up:  Pharmacist 06/13/24. PCP clinic visit in 08/12/24.  Jenkins Graces, PharmD PGY1 Pharmacy Resident [phone]

## 2024-05-12 ENCOUNTER — Ambulatory Visit: Payer: Self-pay | Attending: Internal Medicine | Admitting: Pharmacist

## 2024-05-12 ENCOUNTER — Other Ambulatory Visit: Payer: Self-pay

## 2024-05-12 VITALS — BP 175/85 | HR 57

## 2024-05-12 DIAGNOSIS — I10 Essential (primary) hypertension: Secondary | ICD-10-CM

## 2024-05-12 MED ORDER — LOSARTAN POTASSIUM 100 MG PO TABS
100.0000 mg | ORAL_TABLET | Freq: Every day | ORAL | 1 refills | Status: AC
  Filled 2024-05-12 – 2024-07-07 (×3): qty 90, 90d supply, fill #0

## 2024-05-13 ENCOUNTER — Encounter: Payer: Self-pay | Admitting: Pharmacist

## 2024-05-13 ENCOUNTER — Ambulatory Visit: Payer: Self-pay | Admitting: Internal Medicine

## 2024-05-13 LAB — BMP8+EGFR
BUN/Creatinine Ratio: 29 — ABNORMAL HIGH (ref 12–28)
BUN: 15 mg/dL (ref 8–27)
CO2: 23 mmol/L (ref 20–29)
Calcium: 9.9 mg/dL (ref 8.7–10.3)
Chloride: 104 mmol/L (ref 96–106)
Creatinine, Ser: 0.52 mg/dL — ABNORMAL LOW (ref 0.57–1.00)
Glucose: 135 mg/dL — ABNORMAL HIGH (ref 70–99)
Potassium: 4.2 mmol/L (ref 3.5–5.2)
Sodium: 141 mmol/L (ref 134–144)
eGFR: 104 mL/min/1.73 (ref >59)

## 2024-05-20 ENCOUNTER — Other Ambulatory Visit: Payer: Self-pay

## 2024-05-21 ENCOUNTER — Other Ambulatory Visit: Payer: Self-pay

## 2024-06-13 ENCOUNTER — Encounter: Payer: Self-pay | Admitting: Pharmacist

## 2024-06-13 ENCOUNTER — Other Ambulatory Visit: Payer: Self-pay

## 2024-06-13 ENCOUNTER — Ambulatory Visit: Payer: Self-pay | Attending: Internal Medicine | Admitting: Pharmacist

## 2024-06-13 DIAGNOSIS — I10 Essential (primary) hypertension: Secondary | ICD-10-CM

## 2024-06-13 MED ORDER — CHLORTHALIDONE 25 MG PO TABS
25.0000 mg | ORAL_TABLET | Freq: Every day | ORAL | 2 refills | Status: AC
  Filled 2024-06-13: qty 30, 30d supply, fill #0

## 2024-06-13 NOTE — Progress Notes (Signed)
 "  [facility] interpreter: IDBETHA Delene LENIS  S:     No chief complaint on file.  63 y.o. female who presents for hypertension evaluation, education, and management. PMH is significant for HTN, T2DM, HLD, obesity, GERD.   Patient was referred and last seen by Primary Care Provider, Dr. Vicci, on 04/11/24.   We saw her on 05/12/24 and her BP was still elevated. We increased her losartan  to 100 mg daily. We also got labs that showed normal kidney function and normal potassium.  Today, patient arrives in spirits and presents without assistance. She reports taking her antihypertensives as prescribed. She has been taking the losartan  as two 50 mg tablets daily for a total daily dose of 100 mg. She took these today along with her spironolactone . Denies dizziness, headache, blurred vision, swelling.   Of note, her dispense hx does not add up with her reported history. She tells me she is taking the spironolactone  but I note that this was last filled 04/22/24 for a 30-day supply. The losartan  was filled as 50 mg for a 90-day supply on 04/02/2024.   Family/Social history:  Non smoker No alcohol  Current antihypertensives include: losartan  100 mg daily, spironolactone  25 mg daily  Antihypertensives tried in the past include: hydrochlorothiazide  (chest pain), amlodipine  (chest pain and dizziness)  Reported home blood pressure readings:  -Has a machine at home but does not check   Patient reported dietary habits: Eats 3 meals/day - no salt Breakfast: eggs with lettuce Lunch: chicken, beef Snacks: fruits (strawberries, apples with yogurt) Drinks: coffee (2 cups), no soda, just water   Patient-reported exercise habits: go to the gym  O:  Vitals:   06/13/24 1450  BP: (!) 177/92  Pulse: 65    Last 3 Office BP readings: BP Readings from Last 3 Encounters:  06/13/24 (!) 177/92  05/12/24 (!) 175/85  04/11/24 (!) 182/74    BMET    Component Value Date/Time   NA 141 05/12/2024 1558   K  4.2 05/12/2024 1558   CL 104 05/12/2024 1558   CO2 23 05/12/2024 1558   GLUCOSE 135 (H) 05/12/2024 1558   GLUCOSE 120 (H) 11/05/2023 1341   BUN 15 05/12/2024 1558   CREATININE 0.52 (L) 05/12/2024 1558   CREATININE 0.66 03/03/2016 1206   CALCIUM  9.9 05/12/2024 1558   GFRNONAA >60 10/15/2020 1644   GFRNONAA >89 03/03/2016 1206   GFRAA 79 07/22/2020 1519   GFRAA >89 03/03/2016 1206    Renal function: CrCl cannot be calculated (Patient's most recent lab result is older than the maximum 21 days allowed.).  Clinical ASCVD: Yes  The 10-year ASCVD risk score (Arnett DK, et al., 2019) is: 28.4%   Values used to calculate the score:     Age: 47 years     Clinically relevant sex: Female     Is Non-Hispanic African American: No     Diabetic: Yes     Tobacco smoker: No     Systolic Blood Pressure: 177 mmHg     Is BP treated: Yes     HDL Cholesterol: 43.1 mg/dL     Total Cholesterol: 252 mg/dL  A/P: Hypertension diagnosed currently uncontrolled on current medications. BP goal < 130/80 mmHg. Medication adherence appears to be suboptimal. Her BP today is higher than it was last month when we increased the dose of her losartan . I suspect she is not being completely honest concerning her adherence. She is also not using the BP cuff we provided her with last  month.   I advised her to stop taking the losartan  50 mg tablets. She agrees to go to the pharmacy to pick-up and start taking the 100 mg tablets. She verbalizes understanding to take one 100 mg tablet daily. I also am having her refill the spironolactone . Her electrolyte status and renal function is okay for addition of chlorthalidone . I told her we will add this back today as well.   -Continue losartan  100 mg daily.  -Continued spironolactone  25 mg daily.  -Started chlorthalidone  25 mg daily. -Patient educated on purpose, proper use, and potential adverse effects of losartan  and spironolactone .  -Recommended checking BP twice a day: once  in the morning before coffee and once in the evening. Write down BP and bring them to next appointment. -F/u labs ordered - none -Counseled on lifestyle modifications for blood pressure control including reduced dietary sodium, increased exercise, adequate sleep. -Encouraged patient to check BP at home and bring log of readings to next visit. Counseled on proper use of home BP cuff.   Results reviewed and written information provided.    Written patient instructions provided. Patient verbalized understanding of treatment plan.  Total time in face to face counseling 30 minutes.    Follow-up:  Pharmacist in 1 month. PCP clinic visit in 08/12/24.  Herlene Fleeta Morris, PharmD, JAQUELINE, CPP Clinical Pharmacist [facility] [phone]  "

## 2024-06-17 ENCOUNTER — Ambulatory Visit: Payer: Self-pay

## 2024-06-25 ENCOUNTER — Other Ambulatory Visit: Payer: Self-pay

## 2024-07-07 ENCOUNTER — Other Ambulatory Visit: Payer: Self-pay

## 2024-08-12 ENCOUNTER — Ambulatory Visit: Payer: Self-pay | Admitting: Internal Medicine
